# Patient Record
Sex: Female | Born: 1945 | Race: White | Hispanic: No | Marital: Married | Smoking: Former smoker
Health system: Southern US, Community
[De-identification: ages and names within clinical notes are randomized; demographics above are authoritative.]

## PROBLEM LIST (undated history)

## (undated) DIAGNOSIS — J449 Chronic obstructive pulmonary disease, unspecified: Secondary | ICD-10-CM

## (undated) DIAGNOSIS — I2510 Atherosclerotic heart disease of native coronary artery without angina pectoris: Secondary | ICD-10-CM

## (undated) DIAGNOSIS — F329 Major depressive disorder, single episode, unspecified: Secondary | ICD-10-CM

## (undated) DIAGNOSIS — F32A Depression, unspecified: Secondary | ICD-10-CM

## (undated) DIAGNOSIS — G4730 Sleep apnea, unspecified: Secondary | ICD-10-CM

## (undated) DIAGNOSIS — Z85850 Personal history of malignant neoplasm of thyroid: Secondary | ICD-10-CM

## (undated) DIAGNOSIS — M1990 Unspecified osteoarthritis, unspecified site: Secondary | ICD-10-CM

## (undated) DIAGNOSIS — F419 Anxiety disorder, unspecified: Secondary | ICD-10-CM

## (undated) DIAGNOSIS — I10 Essential (primary) hypertension: Secondary | ICD-10-CM

## (undated) DIAGNOSIS — E559 Vitamin D deficiency, unspecified: Secondary | ICD-10-CM

## (undated) DIAGNOSIS — E119 Type 2 diabetes mellitus without complications: Secondary | ICD-10-CM

## (undated) HISTORY — DX: Sleep apnea, unspecified: G47.30

## (undated) HISTORY — DX: Essential (primary) hypertension: I10

## (undated) HISTORY — DX: Personal history of malignant neoplasm of thyroid: Z85.850

## (undated) HISTORY — PX: EYE SURGERY: SHX253

## (undated) HISTORY — PX: CHOLECYSTECTOMY: SHX55

## (undated) HISTORY — DX: Anxiety disorder, unspecified: F41.9

## (undated) HISTORY — PX: APPENDECTOMY: SHX54

## (undated) HISTORY — DX: Vitamin D deficiency, unspecified: E55.9

## (undated) HISTORY — DX: Atherosclerotic heart disease of native coronary artery without angina pectoris: I25.10

## (undated) HISTORY — PX: TONSILLECTOMY: SUR1361

## (undated) HISTORY — DX: Unspecified osteoarthritis, unspecified site: M19.90

## (undated) HISTORY — DX: Chronic obstructive pulmonary disease, unspecified: J44.9

## (undated) HISTORY — PX: ABDOMINAL HYSTERECTOMY: SHX81

## (undated) HISTORY — PX: BRAIN SURGERY: SHX531

## (undated) HISTORY — DX: Depression, unspecified: F32.A

## (undated) HISTORY — DX: Major depressive disorder, single episode, unspecified: F32.9

## (undated) HISTORY — DX: Type 2 diabetes mellitus without complications: E11.9

---

## 2008-05-16 HISTORY — PX: REPLACEMENT TOTAL KNEE: SUR1224

## 2012-09-13 DIAGNOSIS — E1142 Type 2 diabetes mellitus with diabetic polyneuropathy: Secondary | ICD-10-CM | POA: Insufficient documentation

## 2013-09-03 ENCOUNTER — Encounter: Payer: Self-pay | Admitting: Neurology

## 2013-09-03 DIAGNOSIS — G8929 Other chronic pain: Secondary | ICD-10-CM | POA: Insufficient documentation

## 2013-09-03 DIAGNOSIS — E119 Type 2 diabetes mellitus without complications: Secondary | ICD-10-CM | POA: Insufficient documentation

## 2013-09-04 DIAGNOSIS — D329 Benign neoplasm of meninges, unspecified: Secondary | ICD-10-CM | POA: Insufficient documentation

## 2013-09-12 ENCOUNTER — Encounter: Payer: Self-pay | Admitting: Neurology

## 2013-09-13 ENCOUNTER — Encounter: Payer: Self-pay | Admitting: Neurology

## 2013-09-13 ENCOUNTER — Telehealth: Payer: Self-pay | Admitting: Neurology

## 2013-09-13 ENCOUNTER — Ambulatory Visit (INDEPENDENT_AMBULATORY_CARE_PROVIDER_SITE_OTHER): Payer: Medicare Other | Admitting: Neurology

## 2013-09-13 VITALS — BP 110/60 | HR 60 | Resp 18 | Ht 65.0 in | Wt 188.0 lb

## 2013-09-13 DIAGNOSIS — R251 Tremor, unspecified: Secondary | ICD-10-CM

## 2013-09-13 DIAGNOSIS — D329 Benign neoplasm of meninges, unspecified: Secondary | ICD-10-CM

## 2013-09-13 DIAGNOSIS — D320 Benign neoplasm of cerebral meninges: Secondary | ICD-10-CM

## 2013-09-13 DIAGNOSIS — G219 Secondary parkinsonism, unspecified: Secondary | ICD-10-CM

## 2013-09-13 DIAGNOSIS — R259 Unspecified abnormal involuntary movements: Secondary | ICD-10-CM

## 2013-09-13 DIAGNOSIS — G212 Secondary parkinsonism due to other external agents: Secondary | ICD-10-CM

## 2013-09-13 DIAGNOSIS — G8929 Other chronic pain: Secondary | ICD-10-CM

## 2013-09-13 NOTE — Progress Notes (Signed)
April Walls was seen today in the movement disorders clinic for neurologic consultation at the request of FROMSON,GERALD, MD.  The consultation is for the evaluation of PD.  No notes accompany the referral regarding this particular diagnosis.  I was able to review records from [facility] neurology, where the pt was previously seen but there were no records re: PD there either that were sent.  Pt is accompanied by her husband who supplements the history.  Pt reports that the first sx was tremor in both hands and that started 2 years ago.  She states that the shaking progressed and she began to have trouble eating and she didn't really seek medical attention for this until 3-4 months ago, when she went to [facility] neurology.  She was given Sinemet.  She gradually worked up to one pill three times per day but it made her very nauseated and she d/c it.  She does state that it "really helped."  Pt also states that she was hospitalized a few weeks ago with a stroke.  Again, I don't have those records.  Pt states that she was just "out of it" and is scared that it will occur again.  I do have an OT eval from that hospital stay that did report that an MRI of the brain was done on 09/03/2013 demonstrate no acute infarct but a tiny right subdural hematoma without mass effect.  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  There was small vessel disease.  Specific Symptoms:  Tremor: yes Voice: no changes Sleep: not sleeping well right now because of anxiety (states that she was recently in the hospital and she thought that she had a stroke and she has ben worried about that)  Vivid Dreams:  yes  Acting out dreams:  no (rare nights she will) Wet Pillows: no Postural symptoms:  yes  Falls?  yes Bradykinesia symptoms: difficulty getting OOC, low couch;  Loss of smell:  no Loss of taste:   no Urinary Incontinence:  yes (just recently started) Difficulty Swallowing:  no Handwriting, micrographia: yes Trouble with ADL's:  no  Trouble buttoning clothing: unknown Depression:  yes (sees psychiatry; on risperdal for several years and thinks that it helps) Memory changes:  yes (quit driving after TKA few years ago; husband does finances and always has; husband distributes meds x 1 year as it became too much; husband actually thinks that memory is good) Hallucinations:  no  visual distortions: no N/V:  no Lightheaded:  yes  Syncope: no Diplopia:  yes (horizontal, monocular) Dyskinesia:  no   ALLERGIES:   Allergies  Allergen Reactions  . Aminoglycosides   . Amoxicillin   . Bactrim [Sulfamethoxazole-Tmp Ds]   . Ceclor [Cefaclor]   . Doxycycline   . Erythromycin   . Gentamycin [Gentamicin]   . Lipitor [Atorvastatin]   . Metformin And Related   . Penicillins   . Pravastatin   . Sulfa Antibiotics   . Tetracyclines & Related   . Vasocon [Naphazoline]   . Zocor [Simvastatin]     CURRENT MEDICATIONS:  Current Outpatient Prescriptions on File Prior to Visit  Medication Sig Dispense Refill  . ALPRAZolam (XANAX) 1 MG tablet Take 1 mg by mouth 4 (four) times daily.      Marland Kitchen atorvastatin (LIPITOR) 20 MG tablet Take 20 mg by mouth daily.      . carbidopa-levodopa (  PARCOPA) 25-100 MG per disintegrating tablet Take 1 tablet by mouth 3 (three) times daily.      . carbidopa-levodopa (SINEMET IR) 25-100 MG per tablet Take 1 tablet by mouth 3 (three) times daily.      . celecoxib (CELEBREX) 200 MG capsule Take 200 mg by mouth 2 (two) times daily.      . Cholecalciferol (VITAMIN D-3 PO) Take 2,000 Units by mouth daily.      . citalopram (CELEXA) 20 MG tablet Take 20 mg by mouth daily.      . Coenzyme Q10 (CO Q 10 PO) Take by mouth daily.      Marland Kitchen dexlansoprazole (DEXILANT) 60 MG capsule Take 60 mg by mouth daily.      Marland Kitchen donepezil (ARICEPT) 10 MG tablet Take 10 mg by mouth 2 (two) times  daily.      . DULoxetine (CYMBALTA) 60 MG capsule Take 60 mg by mouth 2 (two) times daily.      . fentaNYL (DURAGESIC - DOSED MCG/HR) 25 MCG/HR patch Place 25 mcg onto the skin every 3 (three) days.      . Fluticasone-Salmeterol (ADVAIR) 250-50 MCG/DOSE AEPB Inhale 1 puff into the lungs 2 (two) times daily.      Marland Kitchen glipiZIDE (GLUCOTROL) 10 MG tablet Take 10 mg by mouth 2 (two) times daily before a meal.      . hydrochlorothiazide (HYDRODIURIL) 25 MG tablet Take 25 mg by mouth daily.      Marland Kitchen lamoTRIgine (LAMICTAL) 25 MG tablet Take 25 mg by mouth daily.      Marland Kitchen levothyroxine (SYNTHROID, LEVOTHROID) 200 MCG tablet Take 200 mcg by mouth daily before breakfast.      . magnesium oxide (MAG-OX) 400 MG tablet Take 400 mg by mouth 3 (three) times daily.      . metoprolol (LOPRESSOR) 50 MG tablet Take 50 mg by mouth 2 (two) times daily.      . Multiple Vitamin (MULTIVITAMIN) tablet Take 1 tablet by mouth daily.      . phenytoin (DILANTIN) 100 MG ER capsule Take by mouth 3 (three) times daily.      . potassium chloride (K-DUR) 10 MEQ tablet Take 10 mEq by mouth daily.      . pregabalin (LYRICA) 300 MG capsule Take 300 mg by mouth 2 (two) times daily.      . promethazine (PHENERGAN) 25 MG tablet Take 25 mg by mouth every 6 (six) hours as needed for nausea or vomiting.      . RABEprazole (ACIPHEX) 20 MG tablet Take 20 mg by mouth daily.      . ranitidine (ZANTAC) 150 MG tablet Take 150 mg by mouth 2 (two) times daily.      . risperiDONE (RISPERDAL) 2 MG tablet Take 2 mg by mouth at bedtime.      Marland Kitchen tiotropium (SPIRIVA) 18 MCG inhalation capsule Place 18 mcg into inhaler and inhale daily.      Marland Kitchen tiZANidine (ZANAFLEX) 4 MG tablet Take 4 mg by mouth every 6 (six) hours as needed for muscle spasms.      Marland Kitchen venlafaxine (EFFEXOR) 100 MG tablet Take 100 mg by mouth 3 (three) times daily.       No current facility-administered medications on file prior to visit.    PAST MEDICAL HISTORY:   Past Medical History    Diagnosis Date  . Diabetes   . ASCVD (arteriosclerotic cardiovascular disease)   . COPD (chronic obstructive pulmonary disease)   . Sleep apnea   .  Vitamin D deficiency   . History of thyroid cancer   . Hypertension   . Anxiety   . Arthritis   . Depression     PAST SURGICAL HISTORY:   Past Surgical History  Procedure Laterality Date  . Replacement total knee Right 2010  . Tonsillectomy    . Cholecystectomy    . Abdominal hysterectomy    . Appendectomy    . Brain surgery      meningioma of cerebellum  . Eye surgery      SOCIAL HISTORY:   History   Social History  . Marital Status: Married    Spouse Name: N/A    Number of Children: N/A  . Years of Education: N/A   Occupational History  . Not on file.   Social History Main Topics  . Smoking status: Former Smoker    Quit date: 11/13/1997  . Smokeless tobacco: Not on file  . Alcohol Use: No  . Drug Use: No  . Sexual Activity: Not on file   Other Topics Concern  . Not on file   Social History Narrative  . No narrative on file    FAMILY HISTORY:   Family Status  Relation Status Death Age  . Mother Deceased     abdominal aneurysm, COPD, diabetes  . Father Deceased     colon cancer  . Brother Alive     diabetes  . Brother Alive     diabetes    ROS:  A complete 10 system review of systems was obtained and was unremarkable apart from what is mentioned above.  PHYSICAL EXAMINATION:    VITALS:   Filed Vitals:   09/13/13 1024  BP: 110/60  Pulse: 60  Resp: 18  Height: 5\' 5"  (1.651 m)  Weight: 188 lb (85.276 kg)    GEN:  The patient appears stated age and is in NAD. HEENT:  Normocephalic, atraumatic.  The mucous membranes are moist. The superficial temporal arteries are without ropiness or tenderness. CV:  RRR Lungs:  CTAB Neck/HEME:  There are no carotid bruits bilaterally.  Neurological examination:  Orientation: The patient is alert and oriented x3. Fund of knowledge is appropriate.  Recent  and remote memory are intact.  Attention and concentration are normal.    Able to name objects and repeat phrases. Cranial nerves: There is good facial symmetry. There is facial hypomimia.  Pupils are equal round and reactive to light bilaterally. Fundoscopic exam reveals clear margins bilaterally. Extraocular muscles are intact. The visual fields are full to confrontational testing. The speech is fluent and clear. Soft palate rises symmetrically and there is no tongue deviation. Hearing is intact to conversational tone. Sensation: Sensation is intact to light and pinprick throughout (facial, trunk, extremities). Vibration is decreased  at the bilateral big toe. There is no extinction with double simultaneous stimulation. There is no sensory dermatomal level identified. Motor: Strength is 5/5 in the bilateral upper and lower extremities.   Shoulder shrug is equal and symmetric.  There is no pronator drift. Deep tendon reflexes: Deep tendon reflexes are 1/4 at the bilateral biceps, triceps, brachioradialis, patella and absent at the bilateral achilles. Plantar responses are downgoing bilaterally.  Movement examination: Tone: There is ? Mild increase tone in the LUE but the pt had difficulty relaxing.  Tone in the RUE and bilateral LE was normal Abnormal movements: There is a tremor in the right and left lower extremity and right and left upper extremity that are all independent of one another  and that change frequencies.  It is a distractible tremor (goes away when distracted). Coordination:  There is no significant decremation with RAM's, in the upper or lower extremities bilaterally. Gait and Station: The patient has difficulty arising out of a deep-seated chair without the use of the hands.  She makes 2 unsuccessful attempts, and then pushes off using her hands.  She then gets out of the chair and walks with an astasia abasia gait.  She does not shuffle, but takes very long unsteady strides.  We only  took a few steps in the room, as she was falling all over this examiner.  ASSESSMENT/PLAN:  1.  Parkinsonism  -I do not think that she has idiopathic Parkinson's disease.  I think that the etiology of the parkinsonism is multifactorial.  I think that the Risperdal is a big contribution due to D2 receptor blockade.  She and I talked about this today.  We also talked about the potential role that Phenergan could play as well and I recommend she stop this.  I do not recommend levodopa right now, and she is off of the medication as she did not tolerate it well.  I would recommend returning to her psychiatrist and seeing if there is an alternative to the Risperdal such as Seroquel or Clozaril.  If she is able to come off of the Risperdal, then we will need 6 months to see how she does clinically after she is off of the medication.  In addition to these factors, I do think that there are possibly some psychogenic qualities to her physical examination, which likely contributes to her gait and tremor as well.  I wrote a letter to her psychiatrist as well regarding the above and, at her request, I will talk with her physical therapist, whom I have left a message for Dagoberto Ligas). 2.  Recent hospitalization  -Pt reports recent stroke and now on dilantin.  I will try to get records as I would like to minimize medication as much as possible.  Pt signed release today. 3.  Chronic neck and back pain  -The patient is currently on fentanyl.  She asked me about treating her chronic pain, which I do not do.  However, I would also recommend limiting as much chronic pain medication as possible given that she is a fall risk.  Addendum:  [facility] records received after patient left.  Pt fell 3 days prior to admission, and then presented with generalized weakness and leaning to the right.  MRI with small right SDH with no mass effect.  Neuro recommended no antiplatelets.  Apparently she had some UE jerking movements and  continuous EEG was performed and was negative for epileptiform activity.  The initial continues EEG demonstrated mild to moderate slowing of electrocerebral activity and the following day, there continued to be slowing but no epileptiform activity, despite the fact that the patient had shaking of the right lower extremity.Marland Kitchen Spell on 4/22 described as generalized shaking involving both sides of face, arms and legs, AMS and left sided weakness after.  However, the admission says that she had a prior hx of seizure and her dilantin level was subtherapeutic and they just increased it.  Pt told me today that this was a new medication for her in the hospital.   She had an Echo that showed LV EF has 60-65% and MRA neck was normal on 09/04/13.  MRI of the brain formal report was done on 09/04/2013 and compared to 04/23/2004.  There  was a "tiny" right frontal subdural hematoma without mass effect.  There is a right posterior fossa meningioma measuring 2.0 x 2.0 x 1.9 cm, which invades a portion of the right transverse sinus, but the right transverse sinus appeared patent.  In 2005, the meningioma measured 1.6 x 1.7 x 1.8 cm.  There was no diffusion abnormality on the MRI of the brain dated 09/04/2013.  Ventricles were normal in size.  It was reported to be minimal T2 hyperintensity in the white matter.  MRA of the brain was done on 09/04/2013 which was normal.  The patient was receiving injections of Haldol while in the hospital.

## 2013-09-13 NOTE — Telephone Encounter (Signed)
Jade, please let pt know that I have taken a thorough look at [facility] records and it said in them that she was on dilantin prior to hospital stay for history of seizure but pt told me this was new medication for her???  Can she clarify?

## 2013-09-13 NOTE — Patient Instructions (Signed)
1.  I do not recommend phenergan as it can make parkinsonism worse 2  I do recommend you talk to your psychiatrist to see if there are alternative medications to the risperdal 3.  I will try to get a copy of your records from [facility].  Please feel free to call me in a few weeks to make sure that we have received and reviewed them and to see if we need to follow up before 6 months to discuss the dilantin and why you are on it.

## 2013-09-16 NOTE — Telephone Encounter (Signed)
Left message on machine for patient to call back.

## 2013-09-16 NOTE — Progress Notes (Signed)
Note faxed to Dr Daneil Dolin at [phone].

## 2013-09-17 NOTE — Telephone Encounter (Signed)
Left another message for patient to call back 

## 2013-09-18 NOTE — Telephone Encounter (Signed)
Send certified letter asking her to call us

## 2013-09-18 NOTE — Telephone Encounter (Signed)
Letter written and mailed.

## 2013-09-18 NOTE — Telephone Encounter (Signed)
Patient has not returned any phone calls.

## 2013-10-01 ENCOUNTER — Telehealth: Payer: Self-pay | Admitting: Neurology

## 2013-10-01 NOTE — Telephone Encounter (Signed)
Pt is returning someone call not sure who called her she said please call [phone] or [phone]

## 2013-10-01 NOTE — Telephone Encounter (Signed)
Spoke with patient's husband. Patient is in the hospital. She will call back when she gets out for clarification on her history.

## 2013-12-12 ENCOUNTER — Emergency Department (HOSPITAL_BASED_OUTPATIENT_CLINIC_OR_DEPARTMENT_OTHER): Payer: Medicare Other

## 2013-12-12 ENCOUNTER — Emergency Department (HOSPITAL_BASED_OUTPATIENT_CLINIC_OR_DEPARTMENT_OTHER)
Admission: EM | Admit: 2013-12-12 | Discharge: 2013-12-12 | Disposition: A | Discharge status: Home / Self Care | Payer: Medicare Other | Attending: Emergency Medicine | Admitting: Emergency Medicine

## 2013-12-12 ENCOUNTER — Encounter (HOSPITAL_BASED_OUTPATIENT_CLINIC_OR_DEPARTMENT_OTHER): Payer: Self-pay | Admitting: Emergency Medicine

## 2013-12-12 DIAGNOSIS — Z87891 Personal history of nicotine dependence: Secondary | ICD-10-CM | POA: Insufficient documentation

## 2013-12-12 DIAGNOSIS — Z79899 Other long term (current) drug therapy: Secondary | ICD-10-CM | POA: Insufficient documentation

## 2013-12-12 DIAGNOSIS — E119 Type 2 diabetes mellitus without complications: Secondary | ICD-10-CM | POA: Insufficient documentation

## 2013-12-12 DIAGNOSIS — I10 Essential (primary) hypertension: Secondary | ICD-10-CM | POA: Diagnosis not present

## 2013-12-12 DIAGNOSIS — R519 Headache, unspecified: Secondary | ICD-10-CM

## 2013-12-12 DIAGNOSIS — I2510 Atherosclerotic heart disease of native coronary artery without angina pectoris: Secondary | ICD-10-CM | POA: Insufficient documentation

## 2013-12-12 DIAGNOSIS — J449 Chronic obstructive pulmonary disease, unspecified: Secondary | ICD-10-CM | POA: Insufficient documentation

## 2013-12-12 DIAGNOSIS — F411 Generalized anxiety disorder: Secondary | ICD-10-CM | POA: Diagnosis not present

## 2013-12-12 DIAGNOSIS — F3289 Other specified depressive episodes: Secondary | ICD-10-CM | POA: Diagnosis not present

## 2013-12-12 DIAGNOSIS — Z8739 Personal history of other diseases of the musculoskeletal system and connective tissue: Secondary | ICD-10-CM | POA: Diagnosis not present

## 2013-12-12 DIAGNOSIS — Z880 Allergy status to penicillin: Secondary | ICD-10-CM | POA: Insufficient documentation

## 2013-12-12 DIAGNOSIS — IMO0002 Reserved for concepts with insufficient information to code with codable children: Secondary | ICD-10-CM | POA: Diagnosis not present

## 2013-12-12 DIAGNOSIS — R51 Headache: Secondary | ICD-10-CM | POA: Insufficient documentation

## 2013-12-12 DIAGNOSIS — F329 Major depressive disorder, single episode, unspecified: Secondary | ICD-10-CM | POA: Insufficient documentation

## 2013-12-12 DIAGNOSIS — Z85850 Personal history of malignant neoplasm of thyroid: Secondary | ICD-10-CM | POA: Diagnosis not present

## 2013-12-12 DIAGNOSIS — J4489 Other specified chronic obstructive pulmonary disease: Secondary | ICD-10-CM | POA: Insufficient documentation

## 2013-12-12 LAB — BASIC METABOLIC PANEL
ANION GAP: 13 (ref 5–15)
BUN: 9 mg/dL (ref 6–23)
CALCIUM: 9.1 mg/dL (ref 8.4–10.5)
CO2: 30 mEq/L (ref 19–32)
CREATININE: 0.5 mg/dL (ref 0.50–1.10)
Chloride: 97 mEq/L (ref 96–112)
GFR calc non Af Amer: >90 mL/min (ref >90)
Glucose, Bld: 187 mg/dL — ABNORMAL HIGH (ref 70–99)
Potassium: 3.9 mEq/L (ref 3.7–5.3)
Sodium: 140 mEq/L (ref 137–147)

## 2013-12-12 LAB — URINALYSIS, ROUTINE W REFLEX MICROSCOPIC
BILIRUBIN URINE: NEGATIVE
Glucose, UA: NEGATIVE mg/dL
Hgb urine dipstick: NEGATIVE
Ketones, ur: NEGATIVE mg/dL
Leukocytes, UA: NEGATIVE
NITRITE: NEGATIVE
Protein, ur: NEGATIVE mg/dL
Specific Gravity, Urine: 1.014 (ref 1.005–1.030)
UROBILINOGEN UA: 0.2 mg/dL (ref 0.0–1.0)
pH: 8.5 — ABNORMAL HIGH (ref 5.0–8.0)

## 2013-12-12 LAB — URINE MICROSCOPIC-ADD ON

## 2013-12-12 LAB — CBC
HCT: 42.2 % (ref 36.0–46.0)
Hemoglobin: 14 g/dL (ref 12.0–15.0)
MCH: 31.9 pg (ref 26.0–34.0)
MCHC: 33.2 g/dL (ref 30.0–36.0)
MCV: 96.1 fL (ref 78.0–100.0)
PLATELETS: 234 10*3/uL (ref 150–400)
RBC: 4.39 MIL/uL (ref 3.87–5.11)
RDW: 13.1 % (ref 11.5–15.5)
WBC: 17.6 10*3/uL — ABNORMAL HIGH (ref 4.0–10.5)

## 2013-12-12 MED ORDER — SODIUM CHLORIDE 0.9 % IV BOLUS (SEPSIS)
1000.0000 mL | Freq: Once | INTRAVENOUS | Status: AC
  Administered 2013-12-12: 1000 mL via INTRAVENOUS

## 2013-12-12 MED ORDER — DIPHENHYDRAMINE HCL 50 MG/ML IJ SOLN
25.0000 mg | Freq: Once | INTRAMUSCULAR | Status: AC
  Administered 2013-12-12: 25 mg via INTRAVENOUS
  Filled 2013-12-12: qty 1

## 2013-12-12 MED ORDER — METOCLOPRAMIDE HCL 5 MG/ML IJ SOLN
10.0000 mg | Freq: Once | INTRAMUSCULAR | Status: AC
  Administered 2013-12-12: 10 mg via INTRAVENOUS
  Filled 2013-12-12: qty 2

## 2013-12-12 NOTE — ED Provider Notes (Signed)
CSN: [id number]     Arrival date & time 12/12/13  3382 History   First MD Initiated Contact with Patient 12/12/13 [phone]     Chief Complaint  Patient presents with  . Headache     (Consider location/radiation/quality/duration/timing/severity/associated sxs/prior Treatment) HPI Pt presents with c/o headache which she first noted this morning.  She states that after she woke up headache gradually worsened. She describes pain as diffuse and constant.  No change in vision or speech.  No weakness of arms or legs.  No vomiting but has felt nauseated.  No fever/chills.  She states that last night before bed she felt more tired than usual, but has otherwise been in her usual state of health.  There are no other associated systemic symptoms, there are no other alleviating or modifying factors.   Past Medical History  Diagnosis Date  . Diabetes   . ASCVD (arteriosclerotic cardiovascular disease)   . COPD (chronic obstructive pulmonary disease)   . Sleep apnea   . Vitamin D deficiency   . History of thyroid cancer   . Hypertension   . Anxiety   . Arthritis   . Depression    Past Surgical History  Procedure Laterality Date  . Replacement total knee Right 2010  . Tonsillectomy    . Cholecystectomy    . Abdominal hysterectomy    . Appendectomy    . Brain surgery      meningioma of cerebellum  . Eye surgery     No family history on file. History  Substance Use Topics  . Smoking status: Former Smoker    Quit date: 11/13/1997  . Smokeless tobacco: Not on file  . Alcohol Use: No   OB History   Grav Para Term Preterm Abortions TAB SAB Ect Mult Living                 Review of Systems ROS reviewed and all otherwise negative except for mentioned in HPI    Allergies  Aminoglycosides; Amoxicillin; Bactrim; Ceclor; Doxycycline; Erythromycin; Gentamycin; Lipitor; Metformin and related; Penicillins; Pravastatin; Sulfa antibiotics; Tetracyclines & related; Vasocon; and Zocor  Home  Medications   Prior to Admission medications   Medication Sig Start Date End Date Taking? Authorizing Provider  HYDROmorphone (DILAUDID) 2 MG tablet Take 2 mg by mouth every 4 (four) hours as needed for severe pain.   Yes Historical Provider, MD  ALPRAZolam Duanne Moron) 1 MG tablet Take 1 mg by mouth 4 (four) times daily.    Historical Provider, MD  atorvastatin (LIPITOR) 20 MG tablet Take 20 mg by mouth daily.    Historical Provider, MD  carbidopa-levodopa (PARCOPA) 25-100 MG per disintegrating tablet Take 1 tablet by mouth 3 (three) times daily.    Historical Provider, MD  carbidopa-levodopa (SINEMET IR) 25-100 MG per tablet Take 1 tablet by mouth 3 (three) times daily.    Historical Provider, MD  celecoxib (CELEBREX) 200 MG capsule Take 200 mg by mouth 2 (two) times daily.    Historical Provider, MD  Cholecalciferol (VITAMIN D-3 PO) Take 2,000 Units by mouth daily.    Historical Provider, MD  citalopram (CELEXA) 20 MG tablet Take 20 mg by mouth daily.    Historical Provider, MD  Coenzyme Q10 (CO Q 10 PO) Take by mouth daily.    Historical Provider, MD  dexlansoprazole (DEXILANT) 60 MG capsule Take 60 mg by mouth daily.    Historical Provider, MD  donepezil (ARICEPT) 10 MG tablet Take 10 mg by mouth 2 (two) times  daily.    Historical Provider, MD  DULoxetine (CYMBALTA) 60 MG capsule Take 60 mg by mouth 2 (two) times daily.    Historical Provider, MD  fentaNYL (DURAGESIC - DOSED MCG/HR) 25 MCG/HR patch Place 25 mcg onto the skin every 3 (three) days.    Historical Provider, MD  Fluticasone-Salmeterol (ADVAIR) 250-50 MCG/DOSE AEPB Inhale 1 puff into the lungs 2 (two) times daily.    Historical Provider, MD  glipiZIDE (GLUCOTROL) 10 MG tablet Take 10 mg by mouth 2 (two) times daily before a meal.    Historical Provider, MD  hydrochlorothiazide (HYDRODIURIL) 25 MG tablet Take 25 mg by mouth daily.    Historical Provider, MD  lamoTRIgine (LAMICTAL) 25 MG tablet Take 25 mg by mouth daily.    Historical  Provider, MD  levothyroxine (SYNTHROID, LEVOTHROID) 200 MCG tablet Take 200 mcg by mouth daily before breakfast.    Historical Provider, MD  magnesium oxide (MAG-OX) 400 MG tablet Take 400 mg by mouth 3 (three) times daily.    Historical Provider, MD  metoprolol (LOPRESSOR) 50 MG tablet Take 50 mg by mouth 2 (two) times daily.    Historical Provider, MD  Multiple Vitamin (MULTIVITAMIN) tablet Take 1 tablet by mouth daily.    Historical Provider, MD  phenytoin (DILANTIN) 100 MG ER capsule Take by mouth 3 (three) times daily.    Historical Provider, MD  potassium chloride (K-DUR) 10 MEQ tablet Take 10 mEq by mouth daily.    Historical Provider, MD  pregabalin (LYRICA) 300 MG capsule Take 300 mg by mouth 2 (two) times daily.    Historical Provider, MD  promethazine (PHENERGAN) 25 MG tablet Take 25 mg by mouth every 6 (six) hours as needed for nausea or vomiting.    Historical Provider, MD  RABEprazole (ACIPHEX) 20 MG tablet Take 20 mg by mouth daily.    Historical Provider, MD  ranitidine (ZANTAC) 150 MG tablet Take 150 mg by mouth 2 (two) times daily.    Historical Provider, MD  risperiDONE (RISPERDAL) 2 MG tablet Take 2 mg by mouth at bedtime.    Historical Provider, MD  tiotropium (SPIRIVA) 18 MCG inhalation capsule Place 18 mcg into inhaler and inhale daily.    Historical Provider, MD  tiZANidine (ZANAFLEX) 4 MG tablet Take 4 mg by mouth every 6 (six) hours as needed for muscle spasms.    Historical Provider, MD  venlafaxine (EFFEXOR) 100 MG tablet Take 100 mg by mouth 3 (three) times daily.    Historical Provider, MD   BP 144/92  Pulse 99  Temp(Src) 98.9 F (37.2 C) (Oral)  Resp 18  Ht 5\' 5"  (1.651 m)  Wt 188 lb (85.276 kg)  BMI 31.28 kg/m2  SpO2 97% Vitals reviewed Physical Exam Physical Examination: General appearance - alert, well appearing, and in no distress Mental status - alert, oriented to person, place, and time Eyes - pupils equal and reactive, extraocular eye movements  intact Mouth - mucous membranes moist, pharynx normal without lesions Neck - supple, no significant adenopathy Chest - clear to auscultation, no wheezes, rales or rhonchi, symmetric air entry Heart - normal rate, regular rhythm, normal S1, S2, no murmurs, rubs, clicks or gallops Abdomen - soft, nontender, nondistended, no masses or organomegaly Neurological - alert, oriented x 3, cranial nerves 2-12 tested and intact, strength 5/5 in extremities x 4, sensation intact Extremities - peripheral pulses normal, no pedal edema, no clubbing or cyanosis Skin - normal coloration and turgor, no rashes  ED Course  Procedures (  including critical care time)  12:20 PM pt is satting 92-94% on RA.  She has had resolution of her headache.  She seems to have mild wheezing which may be affected her O2 sat, she also has hx of COPD and has her inhalers to use at home.   Labs Review Labs Reviewed  CBC - Abnormal; Notable for the following:    WBC 17.6 (*)    All other components within normal limits  BASIC METABOLIC PANEL - Abnormal; Notable for the following:    Glucose, Bld 187 (*)    All other components within normal limits  URINALYSIS, ROUTINE W REFLEX MICROSCOPIC - Abnormal; Notable for the following:    APPearance TURBID (*)    pH 8.5 (*)    All other components within normal limits  URINE MICROSCOPIC-ADD ON - Abnormal; Notable for the following:    Bacteria, UA FEW (*)    All other components within normal limits  URINE CULTURE    Imaging Review Dg Chest 2 View  12/12/2013   CLINICAL DATA:  Headache.  EXAM: CHEST  2 VIEW  COMPARISON:  None.  FINDINGS: Mediastinum and hilar structures normal. Lungs are clear. Cardiac structures are unremarkable. No pleural effusion or pneumothorax. No acute bony abnormality.  IMPRESSION: No acute abnormality.   Electronically Signed   By: Marcello Moores  Register   On: 12/12/2013 11:57   Ct Head Wo Contrast  12/12/2013   CLINICAL DATA:  Headaches  EXAM: CT HEAD WITHOUT  CONTRAST  TECHNIQUE: Contiguous axial images were obtained from the base of the skull through the vertex without intravenous contrast.  COMPARISON:  05/12/2012  FINDINGS: The bony calvarium is intact. A calcified meningioma is again noted in the right posterior fossa and stable. Very mild atrophic changes are seen. Mild chronic white matter ischemic changes noted. No findings to suggest acute hemorrhage, acute infarction or other space-occupying mass lesion are noted.  IMPRESSION: Chronic changes without acute abnormality.  Stable right posterior fossa meningioma.   Electronically Signed   By: Inez Catalina M.D.   On: 12/12/2013 10:25     EKG Interpretation None      MDM   Final diagnoses:  Headache, unspecified headache type    Pt presenting with c/o headache.  She has a normal neurologic exam. Head CT reassuring, she knows about the meningioma and it appears stable per radiology.  Pt was found to have o2 sats in low 90s- mid 90s off oxygen-she does have hx of COPD.  Due to having leukocytosis CXR obtained and this appeared normal as well.   Xray images reviewed and interpreted by me as well.  Urinalysis is not convincing for UTI, and urine cutlure sent. After migraine cocktail in the ED patient has had complete resolution of the headache and is requesting discharge.  She was advised to see her PMD and have bloodwork rechecked. Discharged with strict return precautions.  Pt agreeable with plan.     Threasa Beards, MD 12/13/13 [phone]

## 2013-12-12 NOTE — ED Notes (Signed)
Headache that started at midnight unrelieved after taking 2 PO Dilaudid at 0600am.  Visual changes.

## 2013-12-12 NOTE — ED Notes (Signed)
In and out cath performed using sterile technique.  Pt tolerated well

## 2013-12-12 NOTE — ED Notes (Signed)
Place pt on 2 liters of oxygen (nasal cannula) due to low saturations

## 2013-12-12 NOTE — Discharge Instructions (Signed)
Return to the ED with any concerns including fever/chills, vomiting and not able to keep down liquids, changes in vision or speech, weakness in arm or leg, fainting, decreased level of alertness/lethargy, or any other alarming symptoms

## 2013-12-12 NOTE — ED Notes (Signed)
Patient transported to CT 

## 2013-12-13 LAB — URINE CULTURE
Colony Count: NO GROWTH
Culture: NO GROWTH

## 2014-02-16 ENCOUNTER — Emergency Department (HOSPITAL_BASED_OUTPATIENT_CLINIC_OR_DEPARTMENT_OTHER): Payer: Medicare Other

## 2014-02-16 ENCOUNTER — Emergency Department (HOSPITAL_BASED_OUTPATIENT_CLINIC_OR_DEPARTMENT_OTHER)
Admission: EM | Admit: 2014-02-16 | Discharge: 2014-02-16 | Disposition: A | Discharge status: Home / Self Care | Payer: Medicare Other | Attending: Emergency Medicine | Admitting: Emergency Medicine

## 2014-02-16 ENCOUNTER — Encounter (HOSPITAL_BASED_OUTPATIENT_CLINIC_OR_DEPARTMENT_OTHER): Payer: Self-pay | Admitting: Emergency Medicine

## 2014-02-16 DIAGNOSIS — Z880 Allergy status to penicillin: Secondary | ICD-10-CM | POA: Diagnosis not present

## 2014-02-16 DIAGNOSIS — R1084 Generalized abdominal pain: Secondary | ICD-10-CM | POA: Diagnosis present

## 2014-02-16 DIAGNOSIS — J449 Chronic obstructive pulmonary disease, unspecified: Secondary | ICD-10-CM | POA: Diagnosis not present

## 2014-02-16 DIAGNOSIS — Z7951 Long term (current) use of inhaled steroids: Secondary | ICD-10-CM | POA: Diagnosis not present

## 2014-02-16 DIAGNOSIS — Z87891 Personal history of nicotine dependence: Secondary | ICD-10-CM | POA: Insufficient documentation

## 2014-02-16 DIAGNOSIS — F419 Anxiety disorder, unspecified: Secondary | ICD-10-CM | POA: Diagnosis not present

## 2014-02-16 DIAGNOSIS — Y9389 Activity, other specified: Secondary | ICD-10-CM | POA: Diagnosis not present

## 2014-02-16 DIAGNOSIS — Y9289 Other specified places as the place of occurrence of the external cause: Secondary | ICD-10-CM | POA: Diagnosis not present

## 2014-02-16 DIAGNOSIS — Z9049 Acquired absence of other specified parts of digestive tract: Secondary | ICD-10-CM | POA: Insufficient documentation

## 2014-02-16 DIAGNOSIS — S80911A Unspecified superficial injury of right knee, initial encounter: Secondary | ICD-10-CM | POA: Insufficient documentation

## 2014-02-16 DIAGNOSIS — Z7901 Long term (current) use of anticoagulants: Secondary | ICD-10-CM | POA: Insufficient documentation

## 2014-02-16 DIAGNOSIS — R0789 Other chest pain: Secondary | ICD-10-CM | POA: Insufficient documentation

## 2014-02-16 DIAGNOSIS — E119 Type 2 diabetes mellitus without complications: Secondary | ICD-10-CM | POA: Diagnosis not present

## 2014-02-16 DIAGNOSIS — I2510 Atherosclerotic heart disease of native coronary artery without angina pectoris: Secondary | ICD-10-CM | POA: Diagnosis not present

## 2014-02-16 DIAGNOSIS — Z79899 Other long term (current) drug therapy: Secondary | ICD-10-CM | POA: Diagnosis not present

## 2014-02-16 DIAGNOSIS — E559 Vitamin D deficiency, unspecified: Secondary | ICD-10-CM | POA: Insufficient documentation

## 2014-02-16 DIAGNOSIS — R110 Nausea: Secondary | ICD-10-CM | POA: Diagnosis not present

## 2014-02-16 DIAGNOSIS — I6782 Cerebral ischemia: Secondary | ICD-10-CM | POA: Diagnosis not present

## 2014-02-16 DIAGNOSIS — R1013 Epigastric pain: Secondary | ICD-10-CM | POA: Diagnosis not present

## 2014-02-16 DIAGNOSIS — M1990 Unspecified osteoarthritis, unspecified site: Secondary | ICD-10-CM | POA: Diagnosis not present

## 2014-02-16 DIAGNOSIS — Z85850 Personal history of malignant neoplasm of thyroid: Secondary | ICD-10-CM | POA: Insufficient documentation

## 2014-02-16 DIAGNOSIS — F329 Major depressive disorder, single episode, unspecified: Secondary | ICD-10-CM | POA: Diagnosis not present

## 2014-02-16 DIAGNOSIS — Z90710 Acquired absence of both cervix and uterus: Secondary | ICD-10-CM | POA: Insufficient documentation

## 2014-02-16 DIAGNOSIS — W0110XA Fall on same level from slipping, tripping and stumbling with subsequent striking against unspecified object, initial encounter: Secondary | ICD-10-CM | POA: Diagnosis not present

## 2014-02-16 DIAGNOSIS — I10 Essential (primary) hypertension: Secondary | ICD-10-CM | POA: Diagnosis not present

## 2014-02-16 LAB — COMPREHENSIVE METABOLIC PANEL
ALT: 24 U/L (ref 0–35)
ANION GAP: 13 (ref 5–15)
AST: 33 U/L (ref 0–37)
Albumin: 3.3 g/dL — ABNORMAL LOW (ref 3.5–5.2)
Alkaline Phosphatase: 153 U/L — ABNORMAL HIGH (ref 39–117)
BUN: 13 mg/dL (ref 6–23)
CALCIUM: 9.1 mg/dL (ref 8.4–10.5)
CO2: 32 mEq/L (ref 19–32)
CREATININE: 0.8 mg/dL (ref 0.50–1.10)
Chloride: 92 mEq/L — ABNORMAL LOW (ref 96–112)
GFR calc Af Amer: 85 mL/min — ABNORMAL LOW (ref >90)
GFR calc non Af Amer: 74 mL/min — ABNORMAL LOW (ref >90)
GLUCOSE: 255 mg/dL — AB (ref 70–99)
Potassium: 3.6 mEq/L — ABNORMAL LOW (ref 3.7–5.3)
SODIUM: 137 meq/L (ref 137–147)
TOTAL PROTEIN: 7.5 g/dL (ref 6.0–8.3)
Total Bilirubin: 0.2 mg/dL — ABNORMAL LOW (ref 0.3–1.2)

## 2014-02-16 LAB — CBC
HCT: 37.2 % (ref 36.0–46.0)
Hemoglobin: 12.7 g/dL (ref 12.0–15.0)
MCH: 31.7 pg (ref 26.0–34.0)
MCHC: 34.1 g/dL (ref 30.0–36.0)
MCV: 92.8 fL (ref 78.0–100.0)
Platelets: 339 10*3/uL (ref 150–400)
RBC: 4.01 MIL/uL (ref 3.87–5.11)
RDW: 13.4 % (ref 11.5–15.5)
WBC: 9.8 10*3/uL (ref 4.0–10.5)

## 2014-02-16 LAB — LIPASE, BLOOD: LIPASE: 55 U/L (ref 11–59)

## 2014-02-16 LAB — TROPONIN I: Troponin I: <0.3 ng/mL (ref <0.30)

## 2014-02-16 MED ORDER — ONDANSETRON 4 MG PO TBDP
4.0000 mg | ORAL_TABLET | Freq: Once | ORAL | Status: AC
  Administered 2014-02-16: 4 mg via ORAL
  Filled 2014-02-16: qty 1

## 2014-02-16 MED ORDER — GI COCKTAIL ~~LOC~~
30.0000 mL | Freq: Once | ORAL | Status: AC
  Administered 2014-02-16: 30 mL via ORAL
  Filled 2014-02-16: qty 30

## 2014-02-16 NOTE — ED Notes (Signed)
Pt's husband at bedside -- reports that pt fell this past Friday (2 days ago). Also reports that pt is typically anxious and emotional. Husband comforting pt. Connye Burkitt, RN

## 2014-02-16 NOTE — Discharge Instructions (Signed)
Chest Wall Pain °Chest wall pain is pain in or around the bones and muscles of your chest. It may take up to 6 weeks to get better. It may take longer if you must stay physically active in your work and activities.  °CAUSES  °Chest wall pain may happen on its own. However, it may be caused by: °· A viral illness like the flu. °· Injury. °· Coughing. °· Exercise. °· Arthritis. °· Fibromyalgia. °· Shingles. °HOME CARE INSTRUCTIONS  °· Avoid overtiring physical activity. Try not to strain or perform activities that cause pain. This includes any activities using your chest or your abdominal and side muscles, especially if heavy weights are used. °· Put ice on the sore area. °¨ Put ice in a plastic bag. °¨ Place a towel between your skin and the bag. °¨ Leave the ice on for 15-20 minutes per hour while awake for the first 2 days. °· Only take over-the-counter or prescription medicines for pain, discomfort, or fever as directed by your caregiver. °SEEK IMMEDIATE MEDICAL CARE IF:  °· Your pain increases, or you are very uncomfortable. °· You have a fever. °· Your chest pain becomes worse. °· You have new, unexplained symptoms. °· You have nausea or vomiting. °· You feel sweaty or lightheaded. °· You have a cough with phlegm (sputum), or you cough up blood. °MAKE SURE YOU:  °· Understand these instructions. °· Will watch your condition. °· Will get help right away if you are not doing well or get worse. °Document Released: 05/02/2005 Document Revised: 07/25/2011 Document Reviewed: 12/27/2010 °[employer]® Patient Information ©2015 [employer]. This information is not intended to replace advice given to you by your health care provider. Make sure you discuss any questions you have with your health care provider. ° °Chest Pain (Nonspecific) °It is often hard to give a specific diagnosis for the cause of chest pain. There is always a chance that your pain could be related to something serious, such as a heart attack or a blood  clot in the lungs. You need to follow up with your health care provider for further evaluation. °CAUSES  °· Heartburn. °· Pneumonia or bronchitis. °· Anxiety or stress. °· Inflammation around your heart (pericarditis) or lung (pleuritis or pleurisy). °· A blood clot in the lung. °· A collapsed lung (pneumothorax). It can develop suddenly on its own (spontaneous pneumothorax) or from trauma to the chest. °· Shingles infection (herpes zoster virus). °The chest wall is composed of bones, muscles, and cartilage. Any of these can be the source of the pain. °· The bones can be bruised by injury. °· The muscles or cartilage can be strained by coughing or overwork. °· The cartilage can be affected by inflammation and become sore (costochondritis). °DIAGNOSIS  °Lab tests or other studies may be needed to find the cause of your pain. Your health care provider may have you take a test called an ambulatory electrocardiogram (ECG). An ECG records your heartbeat patterns over a 24-hour period. You may also have other tests, such as: °· Transthoracic echocardiogram (TTE). During echocardiography, sound waves are used to evaluate how blood flows through your heart. °· Transesophageal echocardiogram (TEE). °· Cardiac monitoring. This allows your health care provider to monitor your heart rate and rhythm in real time. °· Holter monitor. This is a portable device that records your heartbeat and can help diagnose heart arrhythmias. It allows your health care provider to track your heart activity for several days, if needed. °· Stress tests by   exercise or by giving medicine that makes the heart beat faster. °TREATMENT  °· Treatment depends on what may be causing your chest pain. Treatment may include: °¨ Acid blockers for heartburn. °¨ Anti-inflammatory medicine. °¨ Pain medicine for inflammatory conditions. °¨ Antibiotics if an infection is present. °· You may be advised to change lifestyle habits. This includes stopping smoking and  avoiding alcohol, caffeine, and chocolate. °· You may be advised to keep your head raised (elevated) when sleeping. This reduces the chance of acid going backward from your stomach into your esophagus. °Most of the time, nonspecific chest pain will improve within 2-3 days with rest and mild pain medicine.  °HOME CARE INSTRUCTIONS  °· If antibiotics were prescribed, take them as directed. Finish them even if you start to feel better. °· For the next few days, avoid physical activities that bring on chest pain. Continue physical activities as directed. °· Do not use any tobacco products, including cigarettes, chewing tobacco, or electronic cigarettes. °· Avoid drinking alcohol. °· Only take medicine as directed by your health care provider. °· Follow your health care provider's suggestions for further testing if your chest pain does not go away. °· Keep any follow-up appointments you made. If you do not go to an appointment, you could develop lasting (chronic) problems with pain. If there is any problem keeping an appointment, call to reschedule. °SEEK MEDICAL CARE IF:  °· Your chest pain does not go away, even after treatment. °· You have a rash with blisters on your chest. °· You have a fever. °SEEK IMMEDIATE MEDICAL CARE IF:  °· You have increased chest pain or pain that spreads to your arm, neck, jaw, back, or abdomen. °· You have shortness of breath. °· You have an increasing cough, or you cough up blood. °· You have severe back or abdominal pain. °· You feel nauseous or vomit. °· You have severe weakness. °· You faint. °· You have chills. °This is an emergency. Do not wait to see if the pain will go away. Get medical help at once. Call your local emergency services (911 in [place] Do not drive yourself to the hospital. °MAKE SURE YOU:  °· Understand these instructions. °· Will watch your condition. °· Will get help right away if you are not doing well or get worse. °Document Released: 02/09/2005 Document Revised:  05/07/2013 Document Reviewed: 12/06/2007 °[employer]® Patient Information ©2015 [employer]. This information is not intended to replace advice given to you by your health care provider. Make sure you discuss any questions you have with your health care provider. ° °

## 2014-02-16 NOTE — ED Provider Notes (Signed)
CSN: [id number]     Arrival date & time 02/16/14  1929 History   This chart was scribed for Evelina Bucy, MD by Randa Evens, ED Scribe. This patient was seen in room MH10/MH10 and the patient's care was started at 7:35 PM.      Chief Complaint  Patient presents with  . Abdominal Pain   Patient is a 68 y.o. female presenting with abdominal pain. The history is provided by the patient. No language interpreter was used.  Abdominal Pain Pain location:  Generalized Pain radiates to:  Does not radiate Pain severity:  Moderate Duration:  1 day Timing:  Constant Progression:  Unchanged Chronicity:  Recurrent Relieved by:  None tried Worsened by:  Movement and eating Ineffective treatments:  None tried Associated symptoms: chest pain (rib pain) and nausea   Associated symptoms: no vomiting    HPI Comments: Merri Dimaano is a 68 y.o. female who presents to the Emergency Department complaining of fall onset 2 days ago. She states fell when going to bed from her wheel chair. She states during her initial fall she fell down onto her knees. She states she hit her head when she fell but denies LOC.   She states she is having new right sided abdominal/ rib pain onset today. She states she has been having some associated nausea. She states she thinks the rib pain is related to the fall or her Hx of acid reflux. She was seen 2 days prior and she states she has an endoscopy performed. She states she thinks that her pain today is from her stomach acid. She states she takes a GI cocktail four times daily but has not been compliant with taking it today due to being at the health center in [place]. She states the last time she took the GI cocktail was tonight before dinner. She states that her rib pain worsened today after eating fried chicken. She states her pain feels similar to previous episodes of acid reflux. Denies vomiting  Past Medical History  Diagnosis Date  . Diabetes   . ASCVD  (arteriosclerotic cardiovascular disease)   . COPD (chronic obstructive pulmonary disease)   . Sleep apnea   . Vitamin D deficiency   . History of thyroid cancer   . Hypertension   . Anxiety   . Arthritis   . Depression    Past Surgical History  Procedure Laterality Date  . Replacement total knee Right 2010  . Tonsillectomy    . Cholecystectomy    . Abdominal hysterectomy    . Appendectomy    . Brain surgery      meningioma of cerebellum  . Eye surgery     No family history on file. History  Substance Use Topics  . Smoking status: Former Smoker    Quit date: 11/13/1997  . Smokeless tobacco: Never Used  . Alcohol Use: No   OB History   Grav Para Term Preterm Abortions TAB SAB Ect Mult Living                 Review of Systems  Cardiovascular: Positive for chest pain (rib pain).  Gastrointestinal: Positive for nausea and abdominal pain. Negative for vomiting.  Neurological: Negative for syncope.  All other systems reviewed and are negative.   Allergies  Aminoglycosides; Amoxicillin; Bactrim; Ceclor; Doxycycline; Erythromycin; Gentamycin; Ibuprofen; Lipitor; Metformin and related; Penicillins; Pravastatin; Sulfa antibiotics; Tetracyclines & related; Vasocon; and Zocor  Home Medications   Prior to Admission medications   Medication Sig  Start Date End Date Taking? Authorizing Provider  ALPRAZolam Duanne Moron) 1 MG tablet Take 1 mg by mouth 4 (four) times daily.    Historical Provider, MD  atorvastatin (LIPITOR) 20 MG tablet Take 20 mg by mouth daily.    Historical Provider, MD  carbidopa-levodopa (PARCOPA) 25-100 MG per disintegrating tablet Take 1 tablet by mouth 3 (three) times daily.    Historical Provider, MD  carbidopa-levodopa (SINEMET IR) 25-100 MG per tablet Take 1 tablet by mouth 3 (three) times daily.    Historical Provider, MD  celecoxib (CELEBREX) 200 MG capsule Take 200 mg by mouth 2 (two) times daily.    Historical Provider, MD  Cholecalciferol (VITAMIN D-3 PO)  Take 2,000 Units by mouth daily.    Historical Provider, MD  citalopram (CELEXA) 20 MG tablet Take 20 mg by mouth daily.    Historical Provider, MD  Coenzyme Q10 (CO Q 10 PO) Take by mouth daily.    Historical Provider, MD  dexlansoprazole (DEXILANT) 60 MG capsule Take 60 mg by mouth daily.    Historical Provider, MD  donepezil (ARICEPT) 10 MG tablet Take 10 mg by mouth 2 (two) times daily.    Historical Provider, MD  DULoxetine (CYMBALTA) 60 MG capsule Take 60 mg by mouth 2 (two) times daily.    Historical Provider, MD  fentaNYL (DURAGESIC - DOSED MCG/HR) 25 MCG/HR patch Place 25 mcg onto the skin every 3 (three) days.    Historical Provider, MD  Fluticasone-Salmeterol (ADVAIR) 250-50 MCG/DOSE AEPB Inhale 1 puff into the lungs 2 (two) times daily.    Historical Provider, MD  glipiZIDE (GLUCOTROL) 10 MG tablet Take 10 mg by mouth 2 (two) times daily before a meal.    Historical Provider, MD  hydrochlorothiazide (HYDRODIURIL) 25 MG tablet Take 25 mg by mouth daily.    Historical Provider, MD  HYDROmorphone (DILAUDID) 2 MG tablet Take 2 mg by mouth every 4 (four) hours as needed for severe pain.    Historical Provider, MD  lamoTRIgine (LAMICTAL) 25 MG tablet Take 25 mg by mouth daily.    Historical Provider, MD  levothyroxine (SYNTHROID, LEVOTHROID) 200 MCG tablet Take 200 mcg by mouth daily before breakfast.    Historical Provider, MD  magnesium oxide (MAG-OX) 400 MG tablet Take 400 mg by mouth 3 (three) times daily.    Historical Provider, MD  metoprolol (LOPRESSOR) 50 MG tablet Take 50 mg by mouth 2 (two) times daily.    Historical Provider, MD  Multiple Vitamin (MULTIVITAMIN) tablet Take 1 tablet by mouth daily.    Historical Provider, MD  phenytoin (DILANTIN) 100 MG ER capsule Take by mouth 3 (three) times daily.    Historical Provider, MD  potassium chloride (K-DUR) 10 MEQ tablet Take 10 mEq by mouth daily.    Historical Provider, MD  pregabalin (LYRICA) 300 MG capsule Take 300 mg by mouth 2  (two) times daily.    Historical Provider, MD  promethazine (PHENERGAN) 25 MG tablet Take 25 mg by mouth every 6 (six) hours as needed for nausea or vomiting.    Historical Provider, MD  RABEprazole (ACIPHEX) 20 MG tablet Take 20 mg by mouth daily.    Historical Provider, MD  ranitidine (ZANTAC) 150 MG tablet Take 150 mg by mouth 2 (two) times daily.    Historical Provider, MD  risperiDONE (RISPERDAL) 2 MG tablet Take 2 mg by mouth at bedtime.    Historical Provider, MD  tiotropium (SPIRIVA) 18 MCG inhalation capsule Place 18 mcg into inhaler and inhale  daily.    Historical Provider, MD  tiZANidine (ZANAFLEX) 4 MG tablet Take 4 mg by mouth every 6 (six) hours as needed for muscle spasms.    Historical Provider, MD  venlafaxine (EFFEXOR) 100 MG tablet Take 100 mg by mouth 3 (three) times daily.    Historical Provider, MD   Triage Vitals: BP 153/78  Pulse 125  Temp(Src) 98.9 F (37.2 C) (Oral)  Resp 20  Ht 5\' 5"  (1.651 m)  Wt 197 lb (89.359 kg)  BMI 32.78 kg/m2  SpO2 95%  Physical Exam  Nursing note and vitals reviewed. Constitutional: She is oriented to person, place, and time. She appears well-developed and well-nourished.  HENT:  Head: Normocephalic and atraumatic.  Right Ear: External ear normal.  Left Ear: External ear normal.  Nose: Nose normal.  Mouth/Throat: Oropharynx is clear and moist.  Eyes: Conjunctivae and EOM are normal. Pupils are equal, round, and reactive to light.  Neck: Normal range of motion. Neck supple.  Cardiovascular: Normal rate, regular rhythm, normal heart sounds and intact distal pulses.   Pulmonary/Chest: Effort normal and breath sounds normal.  Abdominal: Soft. Bowel sounds are normal. She exhibits no distension. There is tenderness (mild, diffuse). There is no rebound and no guarding.  Musculoskeletal:       Right knee: She exhibits decreased range of motion and swelling. She exhibits no effusion (can move knee 0-90).  Neurological: She is alert and  oriented to person, place, and time. She has normal reflexes.  Skin: Skin is warm and dry.  Psychiatric: She has a normal mood and affect. Her behavior is normal. Judgment and thought content normal.    ED Course  Procedures (including critical care time) DIAGNOSTIC STUDIES: Oxygen Saturation is 95% on RA, adequate by my interpretation.    COORDINATION OF CARE: 7:53 PM-Discussed treatment plan which includes GI cocktail with pt at bedside and pt agreed to plan.     Labs Review Labs Reviewed  CBC  COMPREHENSIVE METABOLIC PANEL  LIPASE, BLOOD  TROPONIN I    Imaging Review Dg Chest 2 View  02/16/2014   CLINICAL DATA:  Chest and rib pain after a fall today.  EXAM: CHEST  2 VIEW  COMPARISON:  PA and lateral chest 12/12/2013.  CT chest 05/13/2012.  FINDINGS: The chest is hyperexpanded with attenuation of the pulmonary vasculature compatible with emphysema. Lungs are clear. No pneumothorax or pleural effusion. Heart size is normal.  IMPRESSION: No acute finding.  Emphysema.   Electronically Signed   By: Inge Rise M.D.   On: 02/16/2014 20:35   Ct Head Wo Contrast  02/16/2014   CLINICAL DATA:  Patient fell 2 days ago at home striking forehead on dresser. Complains of frontal soreness. Altered mental status.  EXAM: CT HEAD WITHOUT CONTRAST  TECHNIQUE: Contiguous axial images were obtained from the base of the skull through the vertex without intravenous contrast.  COMPARISON:  12/12/2013  FINDINGS: Ventricles, cisterns and other CSF spaces are within normal. There is minimal chronic ischemic microvascular disease. There is no change in patient's dural-based calcified mass over the posterior aspect of the right posterior fossa likely calcified meningioma. There is no mass effect, midline shift or acute hemorrhage. There is no evidence of acute infarction. Remaining bones and soft tissues are unremarkable.  IMPRESSION: No acute intracranial findings.  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  Mild chronic ischemic microvascular disease.   Electronically Signed   By: Marin Olp M.D.  On: 02/16/2014 20:34     EKG Interpretation None      Date: 02/16/2014  Rate: 121  Rhythm: normal sinus rhythm  QRS Axis: normal  Intervals: normal  ST/T Wave abnormalities: normal  Conduction Disutrbances:none  Narrative Interpretation:   Old EKG Reviewed: none available    MDM   Final diagnoses:  Burning chest pain   Patient here with burning epigastric pain. Began after eating fried chicken. Had recent EGD 2 days ago, has been doing well since. Had a fall 2 days ago, hit her R knee and head. CT Head ordered for tomorrow by PCP, will do it now. On exam, no crepitus, lungs clear. Extremely tearful. When asked why she is tearful, she told me, "I hate that you would think negative of my husband, he is a good man." There is a definite Psych component to her symptoms.  EKG with sinus tach, rate in the 120s, due to her crying and being upset. Will check CXR, doubt perforated esophagus with no crepitus and well-appearance. She has been taking special GI cocktails 4 times a day and didn't get all her doses today because she was getting evaluated for her knee and head pain.  Labs ok. The burning eased off after GI cocktail. Patient doesn't want further testing, she would like to go home. I discussed this with husband, he would like to go home as well since she wants to. I explained I would like her to stay for further cardiac testing and abdominal CT, but she would like to go home.  I instructed patient to f/u with her PCP tomorrow.     I personally performed the services described in this documentation, which was scribed in my presence. The recorded information has been reviewed and is accurate.       Evelina Bucy, MD 02/16/14 2122

## 2014-02-16 NOTE — ED Notes (Signed)
EMS called by pt -- pt reports her R ribs began hurting; reports fall a week ago Friday saying she hit her head and R knee; pt very tearful at this time. Connye Burkitt, RN

## 2014-02-16 NOTE — ED Notes (Signed)
Patient complains of rib and chest pain.

## 2014-02-24 ENCOUNTER — Telehealth: Payer: Self-pay | Admitting: Neurology

## 2014-02-24 NOTE — Telephone Encounter (Signed)
I talked with the PT taking care of the patient.  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  She had fall and fx hip and has been dealing with chronic pain since and not progressing with therapy due to pain/narcotic pain meds.  However, therapist did say tremor gone now.  Pt can f/u prn

## 2014-02-24 NOTE — Telephone Encounter (Signed)
Pt called to cancel her 03/21/14 f/u appt. She did not want to give a reason to why.

## 2014-03-21 ENCOUNTER — Ambulatory Visit: Payer: Medicare Other | Admitting: Neurology

## 2014-05-16 ENCOUNTER — Emergency Department (HOSPITAL_BASED_OUTPATIENT_CLINIC_OR_DEPARTMENT_OTHER)
Admission: EM | Admit: 2014-05-16 | Discharge: 2014-05-16 | Disposition: A | Discharge status: Home / Self Care | Payer: Medicare Other | Attending: Emergency Medicine | Admitting: Emergency Medicine

## 2014-05-16 ENCOUNTER — Emergency Department (HOSPITAL_BASED_OUTPATIENT_CLINIC_OR_DEPARTMENT_OTHER): Payer: Medicare Other

## 2014-05-16 ENCOUNTER — Encounter (HOSPITAL_BASED_OUTPATIENT_CLINIC_OR_DEPARTMENT_OTHER): Payer: Self-pay

## 2014-05-16 DIAGNOSIS — E119 Type 2 diabetes mellitus without complications: Secondary | ICD-10-CM | POA: Insufficient documentation

## 2014-05-16 DIAGNOSIS — Z880 Allergy status to penicillin: Secondary | ICD-10-CM | POA: Diagnosis not present

## 2014-05-16 DIAGNOSIS — I10 Essential (primary) hypertension: Secondary | ICD-10-CM | POA: Insufficient documentation

## 2014-05-16 DIAGNOSIS — G4730 Sleep apnea, unspecified: Secondary | ICD-10-CM | POA: Diagnosis not present

## 2014-05-16 DIAGNOSIS — M1990 Unspecified osteoarthritis, unspecified site: Secondary | ICD-10-CM | POA: Diagnosis not present

## 2014-05-16 DIAGNOSIS — F329 Major depressive disorder, single episode, unspecified: Secondary | ICD-10-CM | POA: Insufficient documentation

## 2014-05-16 DIAGNOSIS — I2510 Atherosclerotic heart disease of native coronary artery without angina pectoris: Secondary | ICD-10-CM | POA: Diagnosis not present

## 2014-05-16 DIAGNOSIS — R059 Cough, unspecified: Secondary | ICD-10-CM

## 2014-05-16 DIAGNOSIS — Z79899 Other long term (current) drug therapy: Secondary | ICD-10-CM | POA: Diagnosis not present

## 2014-05-16 DIAGNOSIS — Z87891 Personal history of nicotine dependence: Secondary | ICD-10-CM | POA: Insufficient documentation

## 2014-05-16 DIAGNOSIS — Z85850 Personal history of malignant neoplasm of thyroid: Secondary | ICD-10-CM | POA: Insufficient documentation

## 2014-05-16 DIAGNOSIS — R05 Cough: Secondary | ICD-10-CM | POA: Diagnosis present

## 2014-05-16 DIAGNOSIS — Z791 Long term (current) use of non-steroidal anti-inflammatories (NSAID): Secondary | ICD-10-CM | POA: Insufficient documentation

## 2014-05-16 DIAGNOSIS — F419 Anxiety disorder, unspecified: Secondary | ICD-10-CM | POA: Diagnosis not present

## 2014-05-16 DIAGNOSIS — J159 Unspecified bacterial pneumonia: Secondary | ICD-10-CM | POA: Insufficient documentation

## 2014-05-16 DIAGNOSIS — Z7951 Long term (current) use of inhaled steroids: Secondary | ICD-10-CM | POA: Insufficient documentation

## 2014-05-16 DIAGNOSIS — J449 Chronic obstructive pulmonary disease, unspecified: Secondary | ICD-10-CM | POA: Diagnosis not present

## 2014-05-16 DIAGNOSIS — J189 Pneumonia, unspecified organism: Secondary | ICD-10-CM

## 2014-05-16 DIAGNOSIS — E559 Vitamin D deficiency, unspecified: Secondary | ICD-10-CM | POA: Diagnosis not present

## 2014-05-16 LAB — CBC WITH DIFFERENTIAL/PLATELET
Basophils Absolute: 0 10*3/uL (ref 0.0–0.1)
Basophils Relative: 0 % (ref 0–1)
EOS ABS: 0.3 10*3/uL (ref 0.0–0.7)
Eosinophils Relative: 3 % (ref 0–5)
HEMATOCRIT: 38.7 % (ref 36.0–46.0)
Hemoglobin: 12.9 g/dL (ref 12.0–15.0)
LYMPHS PCT: 32 % (ref 12–46)
Lymphs Abs: 3 10*3/uL (ref 0.7–4.0)
MCH: 31.9 pg (ref 26.0–34.0)
MCHC: 33.3 g/dL (ref 30.0–36.0)
MCV: 95.8 fL (ref 78.0–100.0)
Monocytes Absolute: 0.8 10*3/uL (ref 0.1–1.0)
Monocytes Relative: 9 % (ref 3–12)
Neutro Abs: 5.2 10*3/uL (ref 1.7–7.7)
Neutrophils Relative %: 56 % (ref 43–77)
Platelets: 242 10*3/uL (ref 150–400)
RBC: 4.04 MIL/uL (ref 3.87–5.11)
RDW: 12.8 % (ref 11.5–15.5)
WBC: 9.3 10*3/uL (ref 4.0–10.5)

## 2014-05-16 LAB — BASIC METABOLIC PANEL
Anion gap: 7 (ref 5–15)
BUN: 21 mg/dL (ref 6–23)
CALCIUM: 9.3 mg/dL (ref 8.4–10.5)
CO2: 32 mmol/L (ref 19–32)
Chloride: 98 mEq/L (ref 96–112)
Creatinine, Ser: 0.53 mg/dL (ref 0.50–1.10)
GFR calc Af Amer: >90 mL/min (ref >90)
Glucose, Bld: 197 mg/dL — ABNORMAL HIGH (ref 70–99)
Potassium: 4.4 mmol/L (ref 3.5–5.1)
Sodium: 137 mmol/L (ref 135–145)

## 2014-05-16 LAB — TROPONIN I: Troponin I: <0.03 ng/mL (ref <0.031)

## 2014-05-16 MED ORDER — LEVOFLOXACIN 750 MG PO TABS
750.0000 mg | ORAL_TABLET | Freq: Every day | ORAL | Status: AC
Start: 2014-05-16 — End: ?

## 2014-05-16 MED ORDER — LEVOFLOXACIN 750 MG PO TABS
750.0000 mg | ORAL_TABLET | Freq: Every day | ORAL | Status: DC
  Administered 2014-05-16: 750 mg via ORAL
  Filled 2014-05-16: qty 1

## 2014-05-16 MED ORDER — GI COCKTAIL ~~LOC~~
30.0000 mL | Freq: Once | ORAL | Status: AC
  Administered 2014-05-16: 30 mL via ORAL
  Filled 2014-05-16: qty 30

## 2014-05-16 NOTE — ED Provider Notes (Signed)
CSN: [id number]     Arrival date & time 05/16/14  1226 History   First MD Initiated Contact with Patient 05/16/14 1245     Chief Complaint  Patient presents with  . Cough     (Consider location/radiation/quality/duration/timing/severity/associated sxs/prior Treatment) HPI Comments: Pt comes in today with non productive cough times 3 weeks. Pt states that she woke up with a burning in her chest last night that was consistent with her reflux and it hasn't gone away so she decided to come in today. Denies cp or sob. States that she is oxygen at night. No fever, n/v, abdominal pain.  The history is provided by the patient. No language interpreter was used.    Past Medical History  Diagnosis Date  . Diabetes   . ASCVD (arteriosclerotic cardiovascular disease)   . COPD (chronic obstructive pulmonary disease)   . Sleep apnea   . Vitamin D deficiency   . History of thyroid cancer   . Hypertension   . Anxiety   . Arthritis   . Depression    Past Surgical History  Procedure Laterality Date  . Replacement total knee Right 2010  . Tonsillectomy    . Cholecystectomy    . Abdominal hysterectomy    . Appendectomy    . Brain surgery      meningioma of cerebellum  . Eye surgery     No family history on file. History  Substance Use Topics  . Smoking status: Former Smoker    Quit date: 11/13/1997  . Smokeless tobacco: Never Used  . Alcohol Use: No   OB History    No data available     Review of Systems  All other systems reviewed and are negative.     Allergies  Aminoglycosides; Amoxicillin; Bactrim; Ceclor; Doxycycline; Erythromycin; Gentamycin; Ibuprofen; Lipitor; Metformin and related; Penicillins; Pravastatin; Sulfa antibiotics; Tetracyclines & related; Vasocon; and Zocor  Home Medications   Prior to Admission medications   Medication Sig Start Date End Date Taking? Authorizing Provider  albuterol (PROVENTIL HFA;VENTOLIN HFA) 108 (90 BASE) MCG/ACT inhaler Inhale 1-2  puffs into the lungs every 4 (four) hours as needed for wheezing or shortness of breath.    Historical Provider, MD  ALPRAZolam Duanne Moron) 1 MG tablet Take 1 mg by mouth 4 (four) times daily as needed.     Historical Provider, MD  atorvastatin (LIPITOR) 20 MG tablet Take 20 mg by mouth daily.    Historical Provider, MD  baclofen (LIORESAL) 10 MG tablet Take 10 mg by mouth 4 (four) times daily.    Historical Provider, MD  carbidopa-levodopa (PARCOPA) 25-100 MG per disintegrating tablet Take 1 tablet by mouth 3 (three) times daily.    Historical Provider, MD  carbidopa-levodopa (SINEMET IR) 25-100 MG per tablet Take 1 tablet by mouth 3 (three) times daily.    Historical Provider, MD  celecoxib (CELEBREX) 200 MG capsule Take 200 mg by mouth 2 (two) times daily.    Historical Provider, MD  Cholecalciferol (VITAMIN D-3 PO) Take 2,000 Units by mouth daily.    Historical Provider, MD  citalopram (CELEXA) 20 MG tablet Take 20 mg by mouth daily.    Historical Provider, MD  Coenzyme Q10 (CO Q 10 PO) Take by mouth daily.    Historical Provider, MD  dexlansoprazole (DEXILANT) 60 MG capsule Take 60 mg by mouth daily.    Historical Provider, MD  donepezil (ARICEPT) 10 MG tablet Take 10 mg by mouth 2 (two) times daily.    Historical Provider, MD  DULoxetine (CYMBALTA) 60 MG capsule Take 120 mg by mouth at bedtime.     Historical Provider, MD  fentaNYL (DURAGESIC - DOSED MCG/HR) 25 MCG/HR patch Place 25 mcg onto the skin every 3 (three) days.    Historical Provider, MD  Fluticasone-Salmeterol (ADVAIR) 250-50 MCG/DOSE AEPB Inhale 1 puff into the lungs 2 (two) times daily.    Historical Provider, MD  glipiZIDE (GLUCOTROL) 10 MG tablet Take 10 mg by mouth 2 (two) times daily before a meal.    Historical Provider, MD  hydrochlorothiazide (HYDRODIURIL) 25 MG tablet Take 25 mg by mouth daily.    Historical Provider, MD  HYDROmorphone (DILAUDID) 2 MG tablet Take 2 mg by mouth every 4 (four) hours as needed for severe pain.     Historical Provider, MD  HYDROmorphone (DILAUDID) 4 MG tablet Take 4 mg by mouth every 6 (six) hours as needed for severe pain.    Historical Provider, MD  iron polysaccharides (NIFEREX) 150 MG capsule Take 150 mg by mouth at bedtime.    Historical Provider, MD  lamoTRIgine (LAMICTAL) 25 MG tablet Take 25 mg by mouth daily.    Historical Provider, MD  levothyroxine (SYNTHROID, LEVOTHROID) 100 MCG tablet Take 100 mcg by mouth daily before breakfast.    Historical Provider, MD  levothyroxine (SYNTHROID, LEVOTHROID) 137 MCG tablet Take 137 mcg by mouth daily before breakfast.    Historical Provider, MD  levothyroxine (SYNTHROID, LEVOTHROID) 200 MCG tablet Take 200 mcg by mouth daily before breakfast.    Historical Provider, MD  magnesium oxide (MAG-OX) 400 MG tablet Take 400 mg by mouth 2 (two) times daily.     Historical Provider, MD  metoprolol (LOPRESSOR) 50 MG tablet Take 50 mg by mouth 2 (two) times daily.    Historical Provider, MD  Multiple Vitamin (MULTIVITAMIN) tablet Take 1 tablet by mouth daily.    Historical Provider, MD  ondansetron (ZOFRAN-ODT) 4 MG disintegrating tablet Take 4 mg by mouth every 6 (six) hours.    Historical Provider, MD  phenytoin (DILANTIN) 100 MG ER capsule Take by mouth 3 (three) times daily.    Historical Provider, MD  phenytoin (DILANTIN) 200 MG ER capsule Take 200 mg by mouth 3 (three) times daily - between meals and at bedtime. 2 tabs in AM and 1 tab at HS    Historical Provider, MD  potassium chloride (K-DUR) 10 MEQ tablet Take 10 mEq by mouth 2 (two) times daily.     Historical Provider, MD  pregabalin (LYRICA) 300 MG capsule Take 300 mg by mouth 2 (two) times daily.    Historical Provider, MD  promethazine (PHENERGAN) 25 MG tablet Take 25 mg by mouth every 6 (six) hours as needed for nausea or vomiting.    Historical Provider, MD  RABEprazole (ACIPHEX) 20 MG tablet Take 20 mg by mouth daily.    Historical Provider, MD  ranitidine (ZANTAC) 150 MG tablet Take 150  mg by mouth 2 (two) times daily.    Historical Provider, MD  risperiDONE (RISPERDAL) 2 MG tablet Take 2 mg by mouth at bedtime.    Historical Provider, MD  tiotropium (SPIRIVA) 18 MCG inhalation capsule Place 18 mcg into inhaler and inhale daily.    Historical Provider, MD  tiZANidine (ZANAFLEX) 4 MG tablet Take 4 mg by mouth every 6 (six) hours as needed for muscle spasms.    Historical Provider, MD  venlafaxine (EFFEXOR) 100 MG tablet Take 100 mg by mouth 3 (three) times daily.    Historical Provider, MD  BP 110/42 mmHg  Pulse 78  Temp(Src) 98.6 F (37 C) (Oral)  Resp 22  Ht 5\' 5"  (1.651 m)  Wt 212 lb (96.163 kg)  BMI 35.28 kg/m2  SpO2 94% Physical Exam  Constitutional: She is oriented to person, place, and time. She appears well-developed and well-nourished.  HENT:  Head: Normocephalic and atraumatic.  Cardiovascular: Normal rate and regular rhythm.   Pulmonary/Chest: Effort normal. She has rales.  Abdominal: Bowel sounds are normal.  Musculoskeletal: Normal range of motion.  Neurological: She is alert and oriented to person, place, and time.  Skin: Skin is warm and dry.  Psychiatric: She has a normal mood and affect.  Nursing note and vitals reviewed.   ED Course  Procedures (including critical care time) Labs Review Labs Reviewed  BASIC METABOLIC PANEL - Abnormal; Notable for the following:    Glucose, Bld 197 (*)    All other components within normal limits  TROPONIN I  CBC WITH DIFFERENTIAL    Imaging Review Dg Chest 2 View  05/16/2014   CLINICAL DATA:  4 week history of cough associated with burning in the chest. Current history of COPD, diabetes and hypertension.  EXAM: CHEST  2 VIEW  COMPARISON:  N/ 08/2013, 12/12/2013.  CT chest 05/13/2012.  FINDINGS: Cardiomediastinal silhouette unremarkable, unchanged. Minimal focal airspace opacity in the right middle lobe. Lungs otherwise clear. Bronchovascular markings normal. No pleural effusions. No pneumothorax.  Degenerative changes and DISH involving the thoracic spine.  IMPRESSION: Minimal bronchopneumonia involving the right middle lobe.   Electronically Signed   By: Evangeline Dakin M.D.   On: 05/16/2014 13:16     EKG Interpretation   Date/Time:  Friday May 16 2014 13:08:31 EST Ventricular Rate:  76 PR Interval:  200 QRS Duration: 78 QT Interval:  382 QTC Calculation: 429 R Axis:   26 Text Interpretation:  Normal sinus rhythm Septal infarct , age  undetermined Abnormal ECG Since previous tracing rate is improved  Confirmed by Canary Brim  MD, MARTHA [phone]) on 05/16/2014 1:42:11 PM      MDM   Final diagnoses:  Cough  CAP (community acquired pneumonia)    Pt having no more burning after the gi cocktail. Pt vital are stable. Will send home of levaquin. Discussed return precautions with pt and husband    Glendell Docker, NP 05/16/14 Center, MD 05/16/14 [phone]

## 2014-05-16 NOTE — Discharge Instructions (Signed)
Follow up with your doctor or return here for continued or worsening symptoms. Pneumonia Pneumonia is an infection of the lungs.  CAUSES Pneumonia may be caused by bacteria or a virus. Usually, these infections are caused by breathing infectious particles into the lungs (respiratory tract). SIGNS AND SYMPTOMS   Cough.  Fever.  Chest pain.  Increased rate of breathing.  Wheezing.  Mucus production. DIAGNOSIS  If you have the common symptoms of pneumonia, your health care provider will typically confirm the diagnosis with a chest X-ray. The X-ray will show an abnormality in the lung (pulmonary infiltrate) if you have pneumonia. Other tests of your blood, urine, or sputum may be done to find the specific cause of your pneumonia. Your health care provider may also do tests (blood gases or pulse oximetry) to see how well your lungs are working. TREATMENT  Some forms of pneumonia may be spread to other people when you cough or sneeze. You may be asked to wear a mask before and during your exam. Pneumonia that is caused by bacteria is treated with antibiotic medicine. Pneumonia that is caused by the influenza virus may be treated with an antiviral medicine. Most other viral infections must run their course. These infections will not respond to antibiotics.  HOME CARE INSTRUCTIONS   Cough suppressants may be used if you are losing too much rest. However, coughing protects you by clearing your lungs. You should avoid using cough suppressants if you can.  Your health care provider may have prescribed medicine if he or she thinks your pneumonia is caused by bacteria or influenza. Finish your medicine even if you start to feel better.  Your health care provider may also prescribe an expectorant. This loosens the mucus to be coughed up.  Take medicines only as directed by your health care provider.  Do not smoke. Smoking is a common cause of bronchitis and can contribute to pneumonia. If you are a  smoker and continue to smoke, your cough may last several weeks after your pneumonia has cleared.  A cold steam vaporizer or humidifier in your room or home may help loosen mucus.  Coughing is often worse at night. Sleeping in a semi-upright position in a recliner or using a couple pillows under your head will help with this.  Get rest as you feel it is needed. Your body will usually let you know when you need to rest. PREVENTION A pneumococcal shot (vaccine) is available to prevent a common bacterial cause of pneumonia. This is usually suggested for:  People over 74 years old.  Patients on chemotherapy.  People with chronic lung problems, such as bronchitis or emphysema.  People with immune system problems. If you are over 65 or have a high risk condition, you may receive the pneumococcal vaccine if you have not received it before. In some countries, a routine influenza vaccine is also recommended. This vaccine can help prevent some cases of pneumonia.You may be offered the influenza vaccine as part of your care. If you smoke, it is time to quit. You may receive instructions on how to stop smoking. Your health care provider can provide medicines and counseling to help you quit. SEEK MEDICAL CARE IF: You have a fever. SEEK IMMEDIATE MEDICAL CARE IF:   Your illness becomes worse. This is especially true if you are elderly or weakened from any other disease.  You cannot control your cough with suppressants and are losing sleep.  You begin coughing up blood.  You develop pain which  is getting worse or is uncontrolled with medicines.  Any of the symptoms which initially brought you in for treatment are getting worse rather than better.  You develop shortness of breath or chest pain. MAKE SURE YOU:   Understand these instructions.  Will watch your condition.  Will get help right away if you are not doing well or get worse. Document Released: 05/02/2005 Document Revised:  09/16/2013 Document Reviewed: 07/22/2010 [facility] Patient Information 2015 [place], [place]. This information is not intended to replace advice given to you by your health care provider. Make sure you discuss any questions you have with your health care provider.

## 2014-05-16 NOTE — ED Notes (Signed)
Non productive cough x 3 weeks. Wears oxygen at night

## 2014-06-04 ENCOUNTER — Ambulatory Visit: Payer: Medicare Other

## 2014-06-06 ENCOUNTER — Ambulatory Visit: Payer: Medicare Other | Admitting: Rehabilitation

## 2014-06-10 ENCOUNTER — Ambulatory Visit: Payer: Medicare Other | Attending: Orthopedic Surgery | Admitting: Rehabilitation

## 2014-06-10 DIAGNOSIS — M25551 Pain in right hip: Secondary | ICD-10-CM | POA: Insufficient documentation

## 2014-06-10 DIAGNOSIS — M170 Bilateral primary osteoarthritis of knee: Secondary | ICD-10-CM | POA: Diagnosis not present

## 2014-06-10 DIAGNOSIS — M7061 Trochanteric bursitis, right hip: Secondary | ICD-10-CM | POA: Insufficient documentation

## 2014-06-10 DIAGNOSIS — R262 Difficulty in walking, not elsewhere classified: Secondary | ICD-10-CM | POA: Insufficient documentation

## 2014-06-10 DIAGNOSIS — M25661 Stiffness of right knee, not elsewhere classified: Secondary | ICD-10-CM | POA: Insufficient documentation

## 2014-06-18 ENCOUNTER — Ambulatory Visit: Payer: Medicare Other | Attending: Orthopedic Surgery | Admitting: Rehabilitation

## 2014-06-18 DIAGNOSIS — M7061 Trochanteric bursitis, right hip: Secondary | ICD-10-CM | POA: Insufficient documentation

## 2014-06-18 DIAGNOSIS — M170 Bilateral primary osteoarthritis of knee: Secondary | ICD-10-CM | POA: Insufficient documentation

## 2014-06-18 DIAGNOSIS — R262 Difficulty in walking, not elsewhere classified: Secondary | ICD-10-CM | POA: Insufficient documentation

## 2014-06-18 DIAGNOSIS — M25661 Stiffness of right knee, not elsewhere classified: Secondary | ICD-10-CM | POA: Insufficient documentation

## 2014-06-18 DIAGNOSIS — M25551 Pain in right hip: Secondary | ICD-10-CM | POA: Insufficient documentation

## 2014-06-19 ENCOUNTER — Emergency Department (HOSPITAL_BASED_OUTPATIENT_CLINIC_OR_DEPARTMENT_OTHER)
Admission: EM | Admit: 2014-06-19 | Discharge: 2014-06-19 | Disposition: A | Discharge status: Home / Self Care | Payer: Medicare Other | Attending: Emergency Medicine | Admitting: Emergency Medicine

## 2014-06-19 ENCOUNTER — Ambulatory Visit: Payer: Medicare Other | Admitting: Rehabilitation

## 2014-06-19 DIAGNOSIS — Z85850 Personal history of malignant neoplasm of thyroid: Secondary | ICD-10-CM | POA: Insufficient documentation

## 2014-06-19 DIAGNOSIS — I10 Essential (primary) hypertension: Secondary | ICD-10-CM | POA: Insufficient documentation

## 2014-06-19 DIAGNOSIS — Z7951 Long term (current) use of inhaled steroids: Secondary | ICD-10-CM | POA: Diagnosis not present

## 2014-06-19 DIAGNOSIS — Z880 Allergy status to penicillin: Secondary | ICD-10-CM | POA: Insufficient documentation

## 2014-06-19 DIAGNOSIS — E559 Vitamin D deficiency, unspecified: Secondary | ICD-10-CM | POA: Diagnosis not present

## 2014-06-19 DIAGNOSIS — E119 Type 2 diabetes mellitus without complications: Secondary | ICD-10-CM | POA: Diagnosis not present

## 2014-06-19 DIAGNOSIS — J449 Chronic obstructive pulmonary disease, unspecified: Secondary | ICD-10-CM | POA: Diagnosis not present

## 2014-06-19 DIAGNOSIS — T50905A Adverse effect of unspecified drugs, medicaments and biological substances, initial encounter: Secondary | ICD-10-CM

## 2014-06-19 DIAGNOSIS — Z792 Long term (current) use of antibiotics: Secondary | ICD-10-CM | POA: Insufficient documentation

## 2014-06-19 DIAGNOSIS — M1990 Unspecified osteoarthritis, unspecified site: Secondary | ICD-10-CM | POA: Insufficient documentation

## 2014-06-19 DIAGNOSIS — F329 Major depressive disorder, single episode, unspecified: Secondary | ICD-10-CM | POA: Insufficient documentation

## 2014-06-19 DIAGNOSIS — T39315A Adverse effect of propionic acid derivatives, initial encounter: Secondary | ICD-10-CM | POA: Insufficient documentation

## 2014-06-19 DIAGNOSIS — Z87891 Personal history of nicotine dependence: Secondary | ICD-10-CM | POA: Diagnosis not present

## 2014-06-19 DIAGNOSIS — F419 Anxiety disorder, unspecified: Secondary | ICD-10-CM

## 2014-06-19 DIAGNOSIS — Z8669 Personal history of other diseases of the nervous system and sense organs: Secondary | ICD-10-CM | POA: Diagnosis not present

## 2014-06-19 DIAGNOSIS — Z79891 Long term (current) use of opiate analgesic: Secondary | ICD-10-CM | POA: Insufficient documentation

## 2014-06-19 LAB — CBC WITH DIFFERENTIAL/PLATELET
BASOS PCT: 0 % (ref 0–1)
Basophils Absolute: 0 10*3/uL (ref 0.0–0.1)
EOS ABS: 0.2 10*3/uL (ref 0.0–0.7)
Eosinophils Relative: 2 % (ref 0–5)
HCT: 41.5 % (ref 36.0–46.0)
Hemoglobin: 14.4 g/dL (ref 12.0–15.0)
Lymphocytes Relative: 24 % (ref 12–46)
Lymphs Abs: 2.6 10*3/uL (ref 0.7–4.0)
MCH: 32.3 pg (ref 26.0–34.0)
MCHC: 34.7 g/dL (ref 30.0–36.0)
MCV: 93 fL (ref 78.0–100.0)
MONOS PCT: 8 % (ref 3–12)
Monocytes Absolute: 0.9 10*3/uL (ref 0.1–1.0)
NEUTROS ABS: 7.3 10*3/uL (ref 1.7–7.7)
Neutrophils Relative %: 66 % (ref 43–77)
PLATELETS: 257 10*3/uL (ref 150–400)
RBC: 4.46 MIL/uL (ref 3.87–5.11)
RDW: 13.2 % (ref 11.5–15.5)
WBC: 11 10*3/uL — ABNORMAL HIGH (ref 4.0–10.5)

## 2014-06-19 LAB — URINALYSIS, ROUTINE W REFLEX MICROSCOPIC
Glucose, UA: NEGATIVE mg/dL
Hgb urine dipstick: NEGATIVE
KETONES UR: NEGATIVE mg/dL
Leukocytes, UA: NEGATIVE
Nitrite: NEGATIVE
Protein, ur: 100 mg/dL — AB
SPECIFIC GRAVITY, URINE: 1.02 (ref 1.005–1.030)
Urobilinogen, UA: 0.2 mg/dL (ref 0.0–1.0)
pH: 6 (ref 5.0–8.0)

## 2014-06-19 LAB — BASIC METABOLIC PANEL
ANION GAP: 8 (ref 5–15)
BUN: 18 mg/dL (ref 6–23)
CHLORIDE: 96 mmol/L (ref 96–112)
CO2: 30 mmol/L (ref 19–32)
Calcium: 9.3 mg/dL (ref 8.4–10.5)
Creatinine, Ser: 0.48 mg/dL — ABNORMAL LOW (ref 0.50–1.10)
GFR calc Af Amer: >90 mL/min (ref >90)
GFR calc non Af Amer: >90 mL/min (ref >90)
GLUCOSE: 242 mg/dL — AB (ref 70–99)
Potassium: 3.8 mmol/L (ref 3.5–5.1)
SODIUM: 134 mmol/L — AB (ref 135–145)

## 2014-06-19 LAB — TROPONIN I: Troponin I: <0.03 ng/mL (ref <0.031)

## 2014-06-19 LAB — URINE MICROSCOPIC-ADD ON

## 2014-06-19 MED ORDER — LORAZEPAM 1 MG PO TABS
0.5000 mg | ORAL_TABLET | Freq: Once | ORAL | Status: AC
  Administered 2014-06-19: 0.5 mg via ORAL
  Filled 2014-06-19: qty 1

## 2014-06-19 NOTE — ED Provider Notes (Signed)
CSN: [id number]     Arrival date & time 06/19/14  0300 History   First MD Initiated Contact with Patient 06/19/14 [phone]     Chief Complaint  Patient presents with  . Anxiety   HPI Last night the patient put on a fentanyl patch and took an ibuprofen.  She had been on fentanyl in the past past but on a lower dose at 25 mcg.   Pt started itching and felt hot all over.  She took off the patch but continued to feel poorly.  She is afraid she is going to die because initially her pain doctor was  prescribed 25 mcg, she said she needed something stronger.  She asked for 100 mcg but he said that dose would kill her.  She had some 100 left over at home and decided to take it on her own last night.    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  She sees pain mangement for that.   No cp or shortness of breath.  No fevers or chilled.  She is feeling flushed and hot all over.    Past Medical History  Diagnosis Date  . Diabetes   . ASCVD (arteriosclerotic cardiovascular disease)   . COPD (chronic obstructive pulmonary disease)   . Sleep apnea   . Vitamin D deficiency   . History of thyroid cancer   . Hypertension   . Anxiety   . Arthritis   . Depression    Past Surgical History  Procedure Laterality Date  . Replacement total knee Right 2010  . Tonsillectomy    . Cholecystectomy    . Abdominal hysterectomy    . Appendectomy    . Brain surgery      meningioma of cerebellum  . Eye surgery     No family history on file. History  Substance Use Topics  . Smoking status: Former Smoker    Quit date: 11/13/1997  . Smokeless tobacco: Never Used  . Alcohol Use: No   OB History    No data available     Review of Systems  Constitutional: Negative for fever.  Respiratory: Negative for wheezing.   Cardiovascular: Negative for chest pain.  Gastrointestinal: Negative for vomiting, abdominal pain and diarrhea.  Genitourinary: Negative for dysuria.  Neurological: Negative for  headaches.  Psychiatric/Behavioral: The patient is nervous/anxious.   All other systems reviewed and are negative.     Allergies  Aminoglycosides; Amoxicillin; Bactrim; Ceclor; Doxycycline; Erythromycin; Gentamycin; Ibuprofen; Lipitor; Metformin and related; Penicillins; Pravastatin; Sulfa antibiotics; Tetracyclines & related; Vasocon; and Zocor  Home Medications   Prior to Admission medications   Medication Sig Start Date End Date Taking? Authorizing Provider  albuterol (PROVENTIL HFA;VENTOLIN HFA) 108 (90 BASE) MCG/ACT inhaler Inhale 1-2 puffs into the lungs every 4 (four) hours as needed for wheezing or shortness of breath.    Historical Provider, MD  ALPRAZolam Duanne Moron) 1 MG tablet Take 1 mg by mouth 4 (four) times daily as needed.     Historical Provider, MD  atorvastatin (LIPITOR) 20 MG tablet Take 20 mg by mouth daily.    Historical Provider, MD  baclofen (LIORESAL) 10 MG tablet Take 10 mg by mouth 4 (four) times daily.    Historical Provider, MD  carbidopa-levodopa (PARCOPA) 25-100 MG per disintegrating tablet Take 1 tablet by mouth 3 (three) times daily.    Historical Provider, MD  carbidopa-levodopa (SINEMET IR) 25-100 MG per tablet Take 1 tablet by mouth 3 (three) times daily.  Historical Provider, MD  celecoxib (CELEBREX) 200 MG capsule Take 200 mg by mouth 2 (two) times daily.    Historical Provider, MD  Cholecalciferol (VITAMIN D-3 PO) Take 2,000 Units by mouth daily.    Historical Provider, MD  citalopram (CELEXA) 20 MG tablet Take 20 mg by mouth daily.    Historical Provider, MD  Coenzyme Q10 (CO Q 10 PO) Take by mouth daily.    Historical Provider, MD  dexlansoprazole (DEXILANT) 60 MG capsule Take 60 mg by mouth daily.    Historical Provider, MD  donepezil (ARICEPT) 10 MG tablet Take 10 mg by mouth 2 (two) times daily.    Historical Provider, MD  DULoxetine (CYMBALTA) 60 MG capsule Take 120 mg by mouth at bedtime.     Historical Provider, MD  fentaNYL (DURAGESIC - DOSED  MCG/HR) 25 MCG/HR patch Place 25 mcg onto the skin every 3 (three) days.    Historical Provider, MD  Fluticasone-Salmeterol (ADVAIR) 250-50 MCG/DOSE AEPB Inhale 1 puff into the lungs 2 (two) times daily.    Historical Provider, MD  glipiZIDE (GLUCOTROL) 10 MG tablet Take 10 mg by mouth 2 (two) times daily before a meal.    Historical Provider, MD  hydrochlorothiazide (HYDRODIURIL) 25 MG tablet Take 25 mg by mouth daily.    Historical Provider, MD  HYDROmorphone (DILAUDID) 2 MG tablet Take 2 mg by mouth every 4 (four) hours as needed for severe pain.    Historical Provider, MD  HYDROmorphone (DILAUDID) 4 MG tablet Take 4 mg by mouth every 6 (six) hours as needed for severe pain.    Historical Provider, MD  iron polysaccharides (NIFEREX) 150 MG capsule Take 150 mg by mouth at bedtime.    Historical Provider, MD  lamoTRIgine (LAMICTAL) 25 MG tablet Take 25 mg by mouth daily.    Historical Provider, MD  levofloxacin (LEVAQUIN) 750 MG tablet Take 1 tablet (750 mg total) by mouth daily. 05/16/14   Glendell Docker, NP  levothyroxine (SYNTHROID, LEVOTHROID) 100 MCG tablet Take 100 mcg by mouth daily before breakfast.    Historical Provider, MD  levothyroxine (SYNTHROID, LEVOTHROID) 137 MCG tablet Take 137 mcg by mouth daily before breakfast.    Historical Provider, MD  levothyroxine (SYNTHROID, LEVOTHROID) 200 MCG tablet Take 200 mcg by mouth daily before breakfast.    Historical Provider, MD  magnesium oxide (MAG-OX) 400 MG tablet Take 400 mg by mouth 2 (two) times daily.     Historical Provider, MD  metoprolol (LOPRESSOR) 50 MG tablet Take 50 mg by mouth 2 (two) times daily.    Historical Provider, MD  Multiple Vitamin (MULTIVITAMIN) tablet Take 1 tablet by mouth daily.    Historical Provider, MD  ondansetron (ZOFRAN-ODT) 4 MG disintegrating tablet Take 4 mg by mouth every 6 (six) hours.    Historical Provider, MD  phenytoin (DILANTIN) 100 MG ER capsule Take by mouth 3 (three) times daily.    Historical  Provider, MD  phenytoin (DILANTIN) 200 MG ER capsule Take 200 mg by mouth 3 (three) times daily - between meals and at bedtime. 2 tabs in AM and 1 tab at HS    Historical Provider, MD  potassium chloride (K-DUR) 10 MEQ tablet Take 10 mEq by mouth 2 (two) times daily.     Historical Provider, MD  pregabalin (LYRICA) 300 MG capsule Take 300 mg by mouth 2 (two) times daily.    Historical Provider, MD  promethazine (PHENERGAN) 25 MG tablet Take 25 mg by mouth every 6 (six) hours as  needed for nausea or vomiting.    Historical Provider, MD  RABEprazole (ACIPHEX) 20 MG tablet Take 20 mg by mouth daily.    Historical Provider, MD  ranitidine (ZANTAC) 150 MG tablet Take 150 mg by mouth 2 (two) times daily.    Historical Provider, MD  risperiDONE (RISPERDAL) 2 MG tablet Take 2 mg by mouth at bedtime.    Historical Provider, MD  tiotropium (SPIRIVA) 18 MCG inhalation capsule Place 18 mcg into inhaler and inhale daily.    Historical Provider, MD  tiZANidine (ZANAFLEX) 4 MG tablet Take 4 mg by mouth every 6 (six) hours as needed for muscle spasms.    Historical Provider, MD  venlafaxine (EFFEXOR) 100 MG tablet Take 100 mg by mouth 3 (three) times daily.    Historical Provider, MD   BP 138/93 mmHg  Pulse 115  Temp(Src) 98.7 F (37.1 C) (Oral)  Resp 20  SpO2 96% Physical Exam  Constitutional: She appears well-developed and well-nourished. No distress.  HENT:  Head: Normocephalic and atraumatic.  Right Ear: External ear normal.  Left Ear: External ear normal.  Eyes: Conjunctivae are normal. Right eye exhibits no discharge. Left eye exhibits no discharge. No scleral icterus.  Neck: Neck supple. No tracheal deviation present.  Cardiovascular: Normal rate, regular rhythm and intact distal pulses.   Pulmonary/Chest: Effort normal and breath sounds normal. No stridor. No respiratory distress. She has no wheezes. She has no rales.  Abdominal: Soft. Bowel sounds are normal. She exhibits no distension. There is  no tenderness. There is no rebound and no guarding.  Musculoskeletal: She exhibits no edema or tenderness.  Neurological: She is alert. She has normal strength. No cranial nerve deficit (no facial droop, extraocular movements intact, no slurred speech) or sensory deficit. She exhibits normal muscle tone. She displays no seizure activity. Coordination normal.  Skin: Skin is warm and dry. No rash noted.  Psychiatric: Her mood appears anxious.  Nursing note and vitals reviewed.   ED Course  Procedures (including critical care time) Labs Review Labs Reviewed  CBC WITH DIFFERENTIAL/PLATELET - Abnormal; Notable for the following:    WBC 11.0 (*)    All other components within normal limits  BASIC METABOLIC PANEL - Abnormal; Notable for the following:    Sodium 134 (*)    Glucose, Bld 242 (*)    Creatinine, Ser 0.48 (*)    All other components within normal limits  URINALYSIS, ROUTINE W REFLEX MICROSCOPIC - Abnormal; Notable for the following:    Bilirubin Urine MODERATE (*)    Protein, ur 100 (*)    All other components within normal limits  URINE MICROSCOPIC-ADD ON - Abnormal; Notable for the following:    Squamous Epithelial / LPF FEW (*)    Bacteria, UA FEW (*)    All other components within normal limits  TROPONIN I      EKG Interpretation   Date/Time:  Thursday June 19 2014 09:52:32 EST Ventricular Rate:  110 PR Interval:  182 QRS Duration: 72 QT Interval:  328 QTC Calculation: 443 R Axis:   -13 Text Interpretation:  Sinus tachycardia Abnormal QRS-T angle, consider  primary T wave abnormality Abnormal ECG Since last tracing rate faster  Confirmed by Brodi Nery  MD-J, Bexton Haak ([id number]) on 06/19/2014 10:06:04 AM      MDM   Final diagnoses:  Anxiety  Medication adverse effect, initial encounter    Patient's symptoms may be related to the pain medications versus an anxiety attack. Patient denies any specific complaints of  chest pain, headache, abdominal pain or shortness of  breath. She feels flushed and anxious.  EKG and cardiac enzymes do not suggest acute ischemia. She is not having any shortness of breath or lungs are clear. She does have a mild tachycardia but I think this is related to her anxiety issues.  Laboratory tests shows mild hyperglycemia but otherwise no other significant abnormalities.  Patient was given a dose of Ativan. I recommended close follow-up in 24 hours. She should return to the emergency room if she starts having chest pain shortness of breath or other worsening symptoms.    Dorie Rank, MD 06/19/14 [phone]

## 2014-06-19 NOTE — ED Notes (Signed)
Pt states she took 1mg  xanax around 7am, but does not feel any better. Pt states she is worried that she is going to die because she used one of her old fentanyl 100mg  transdermal patches yesterday, pt states she wore it from approx 8am yesterday until this morning, when she removed it. Pt states that she was told not to use her old patches by a pain doctor "months ago", but she used one anyway.

## 2014-06-20 LAB — URINE CULTURE: Colony Count: 70000

## 2014-06-24 ENCOUNTER — Ambulatory Visit: Payer: Medicare Other | Admitting: Rehabilitation

## 2014-06-24 DIAGNOSIS — M7061 Trochanteric bursitis, right hip: Secondary | ICD-10-CM | POA: Diagnosis present

## 2014-06-24 DIAGNOSIS — M170 Bilateral primary osteoarthritis of knee: Secondary | ICD-10-CM | POA: Diagnosis not present

## 2014-06-24 DIAGNOSIS — R262 Difficulty in walking, not elsewhere classified: Secondary | ICD-10-CM | POA: Diagnosis not present

## 2014-06-24 DIAGNOSIS — M25551 Pain in right hip: Secondary | ICD-10-CM | POA: Diagnosis not present

## 2014-06-24 DIAGNOSIS — M25661 Stiffness of right knee, not elsewhere classified: Secondary | ICD-10-CM | POA: Diagnosis not present

## 2014-06-25 ENCOUNTER — Ambulatory Visit: Payer: Medicare Other | Admitting: Rehabilitation

## 2014-06-26 ENCOUNTER — Ambulatory Visit: Payer: Medicare Other | Admitting: Rehabilitation

## 2014-06-26 DIAGNOSIS — M7061 Trochanteric bursitis, right hip: Secondary | ICD-10-CM | POA: Diagnosis not present

## 2014-06-30 ENCOUNTER — Ambulatory Visit: Payer: Medicare Other | Admitting: Rehabilitation

## 2014-07-02 ENCOUNTER — Ambulatory Visit: Payer: Medicare Other | Admitting: Rehabilitation

## 2014-07-09 ENCOUNTER — Ambulatory Visit: Payer: Medicare Other | Admitting: Rehabilitation

## 2014-07-16 ENCOUNTER — Ambulatory Visit: Payer: Medicare Other | Admitting: Rehabilitation

## 2016-02-07 IMAGING — CT CT HEAD W/O CM
1 series · 16 of 30 positions shown, 20 images · non-contrast
Comparison: 12/12/2013

CLINICAL DATA: Patient fell 2 days ago at home striking forehead on
dresser. Complains of frontal soreness. Altered mental status.

EXAM:
CT HEAD WITHOUT CONTRAST
TECHNIQUE: Contiguous axial images were obtained from the base of the skull
through the vertex without intravenous contrast.

[Series 2: head 4.8 h37s · axial · 0.46mm/px · z∈[-238,-82]mm · 16 of 36 slices shown, 20 images]
[im 2/36  brain]
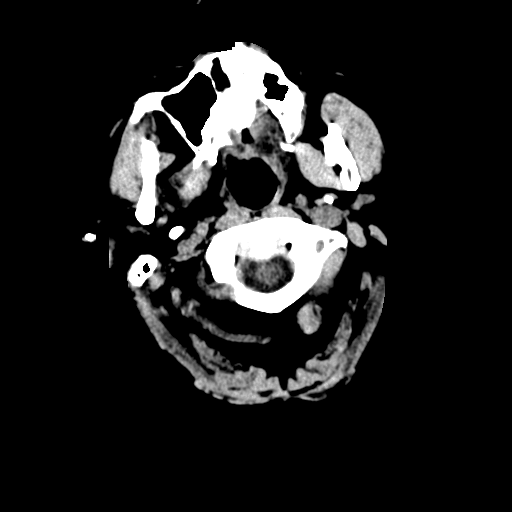
[im 2/36  bone]
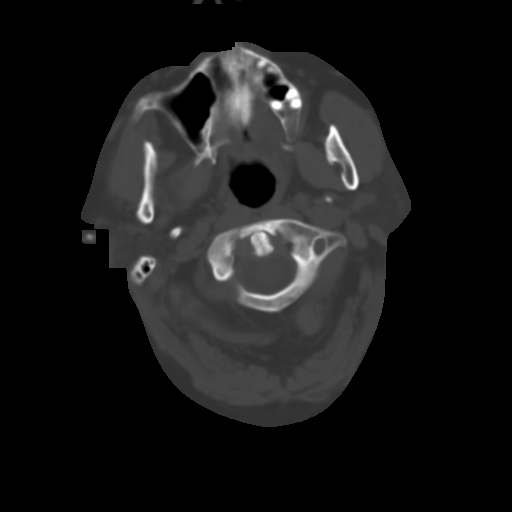
[im 4/36  brain]
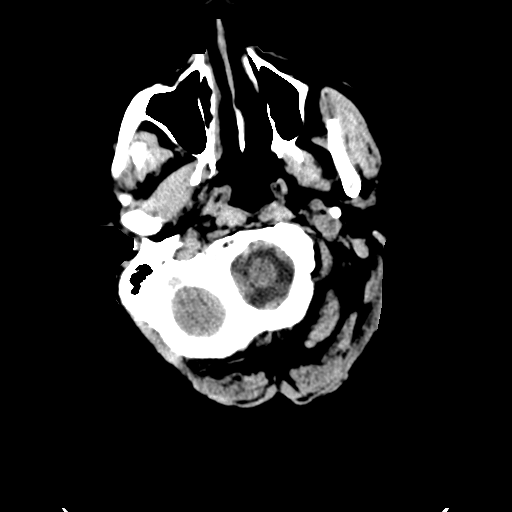
[im 7/36  brain]
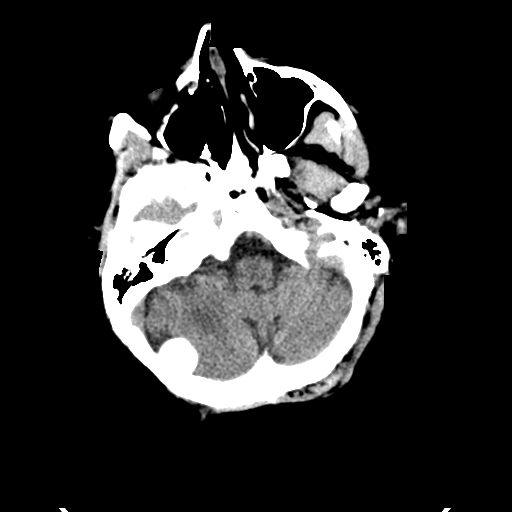
[im 9/36  brain]
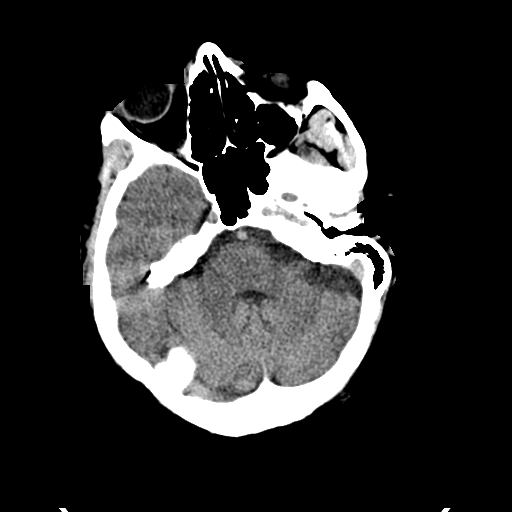
[im 10/36  brain]
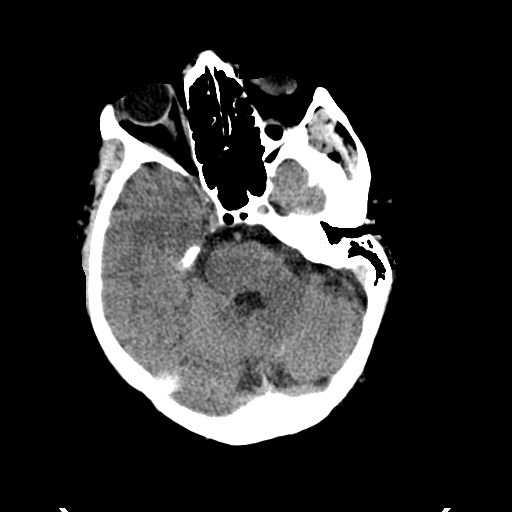
[im 10/36  bone]
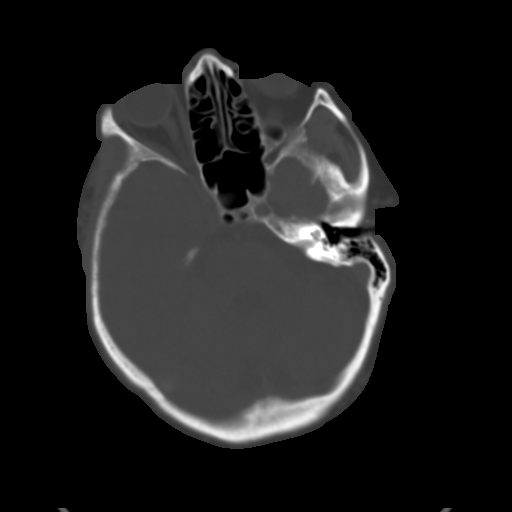
[im 13/36  brain]
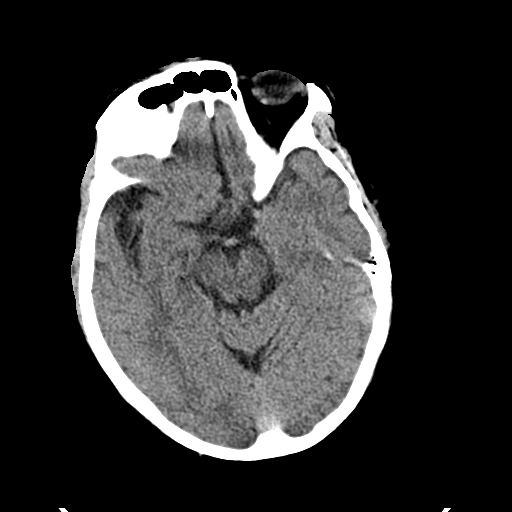
[im 15/36  brain]
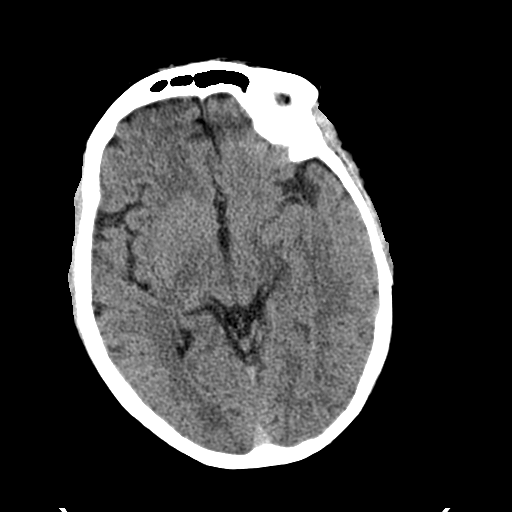
[im 17/36  brain]
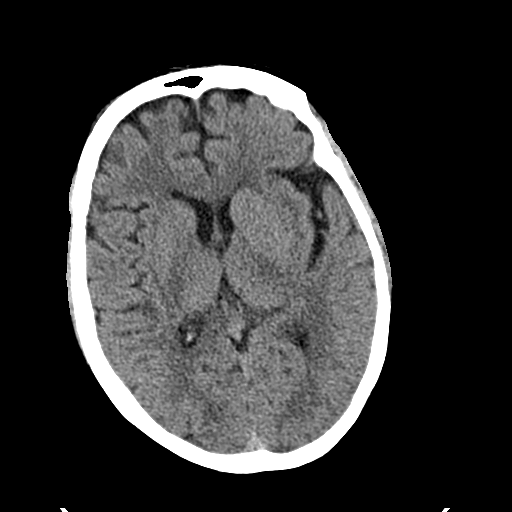
[im 19/36  brain]
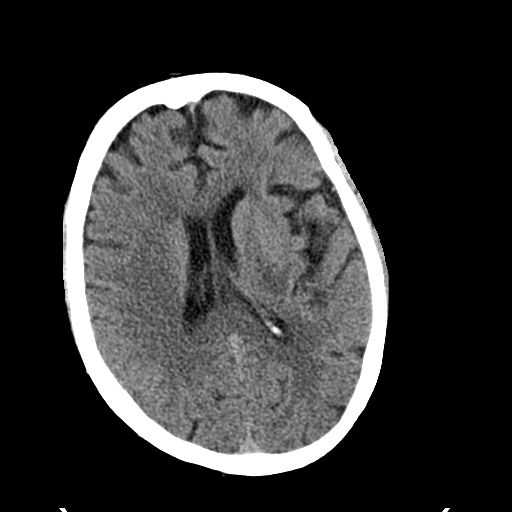
[im 19/36  bone]
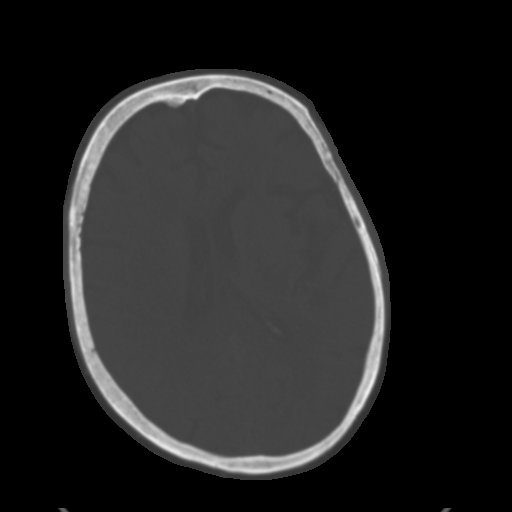
[im 21/36  brain]
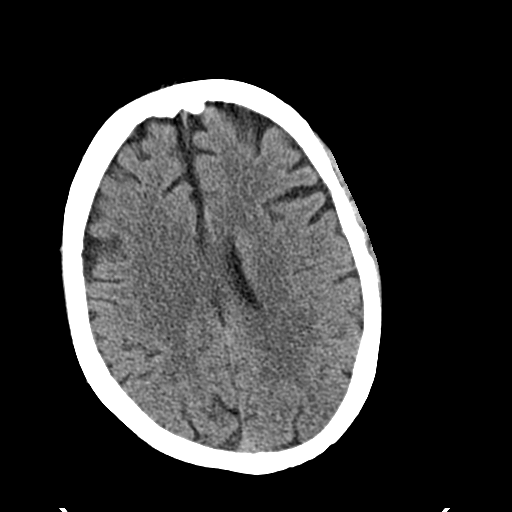
[im 23/36  brain]
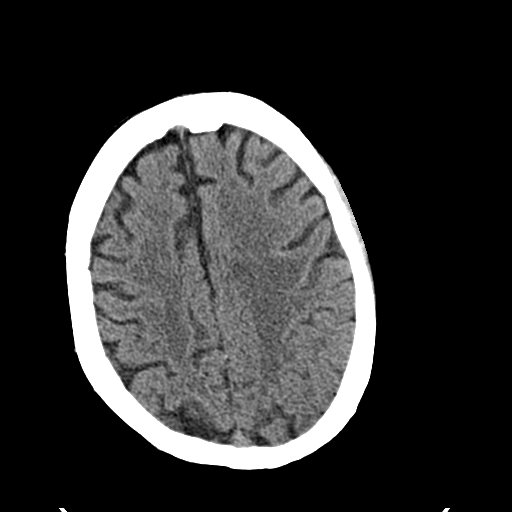
[im 26/36  brain]
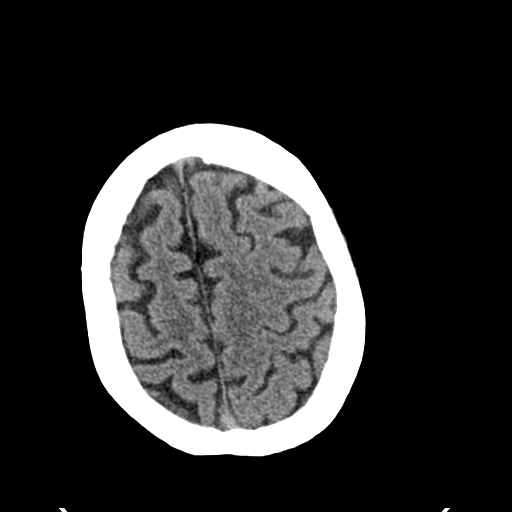
[im 27/36  brain]
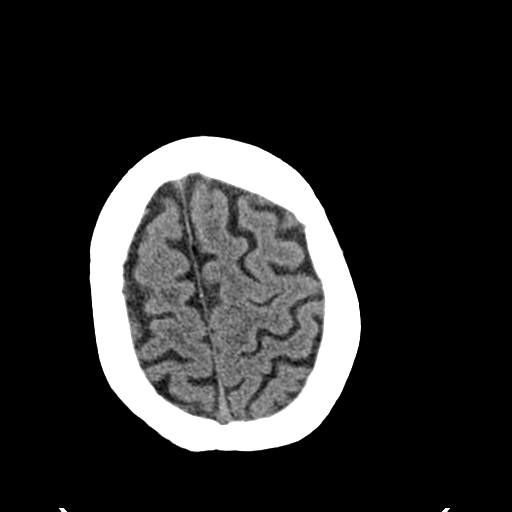
[im 27/36  bone]
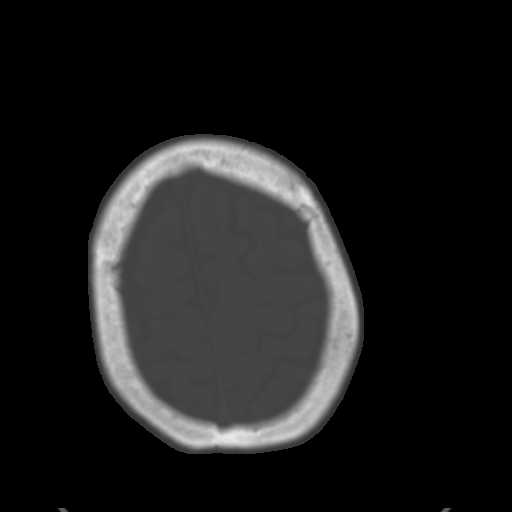
[im 29/36  brain]
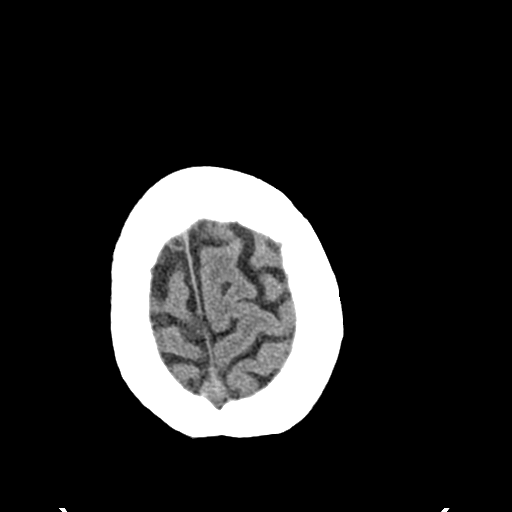
[im 32/36  brain]
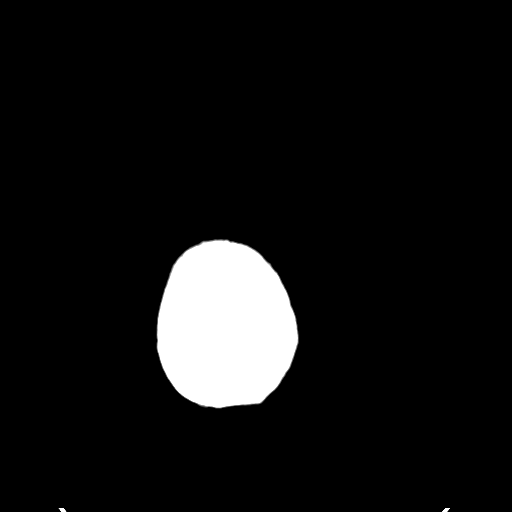
[im 34/36  brain]
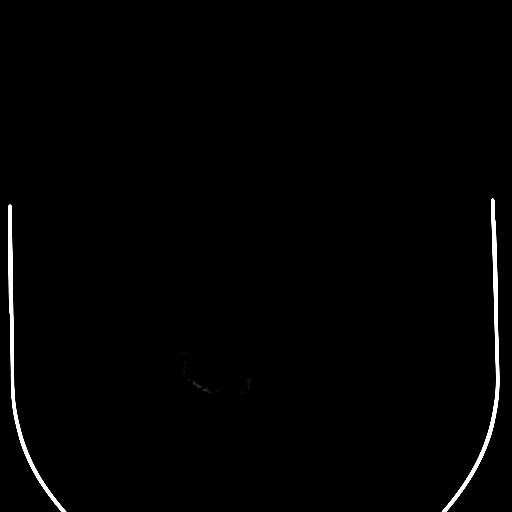

[16 of 30 positions shown; findings below may reference images not displayed]

FINDINGS: Ventricles, cisterns and other CSF spaces are within normal. There
is minimal chronic ischemic microvascular disease. There is no
change in patient's dural-based calcified mass over the posterior
aspect of the right posterior fossa likely calcified meningioma.
There is no mass effect, midline shift or acute hemorrhage. There is
no evidence of acute infarction. Remaining bones and soft tissues
are unremarkable.
IMPRESSION: No acute intracranial findings.

Stable calcified extra-axial mass over the right posterior fossa
likely calcified meningioma.

Mild chronic ischemic microvascular disease.

## 2016-05-06 IMAGING — CR DG CHEST 2V
2 series · 2 of 2 positions shown · non-contrast
Comparison: N/ [DATE], 12/12/2013.  CT chest 05/13/2012.

CLINICAL DATA: 4 week history of cough associated with burning in
the chest. Current history of COPD, diabetes and hypertension.

EXAM:
CHEST  2 VIEW

[w chest ap]
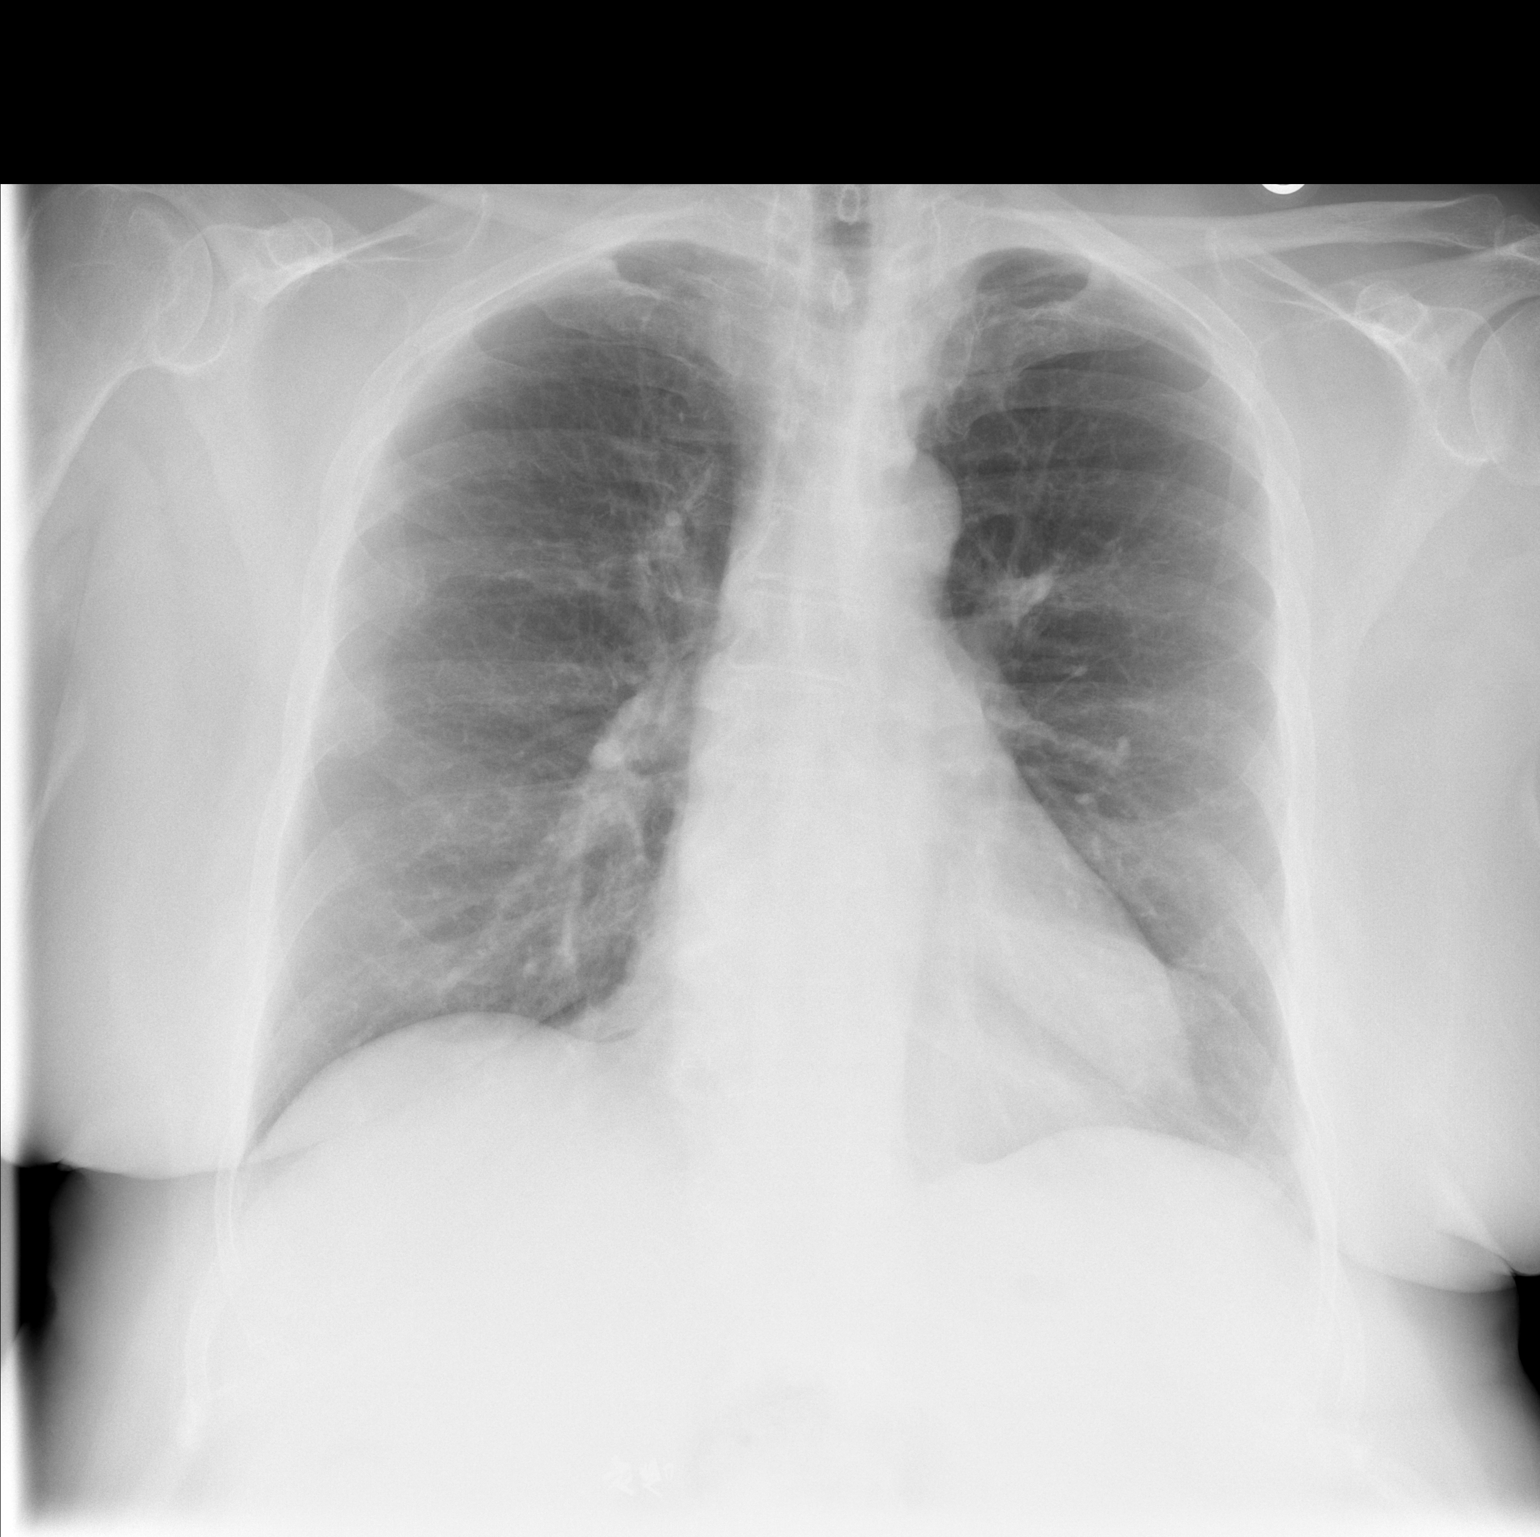

[w chest lat]
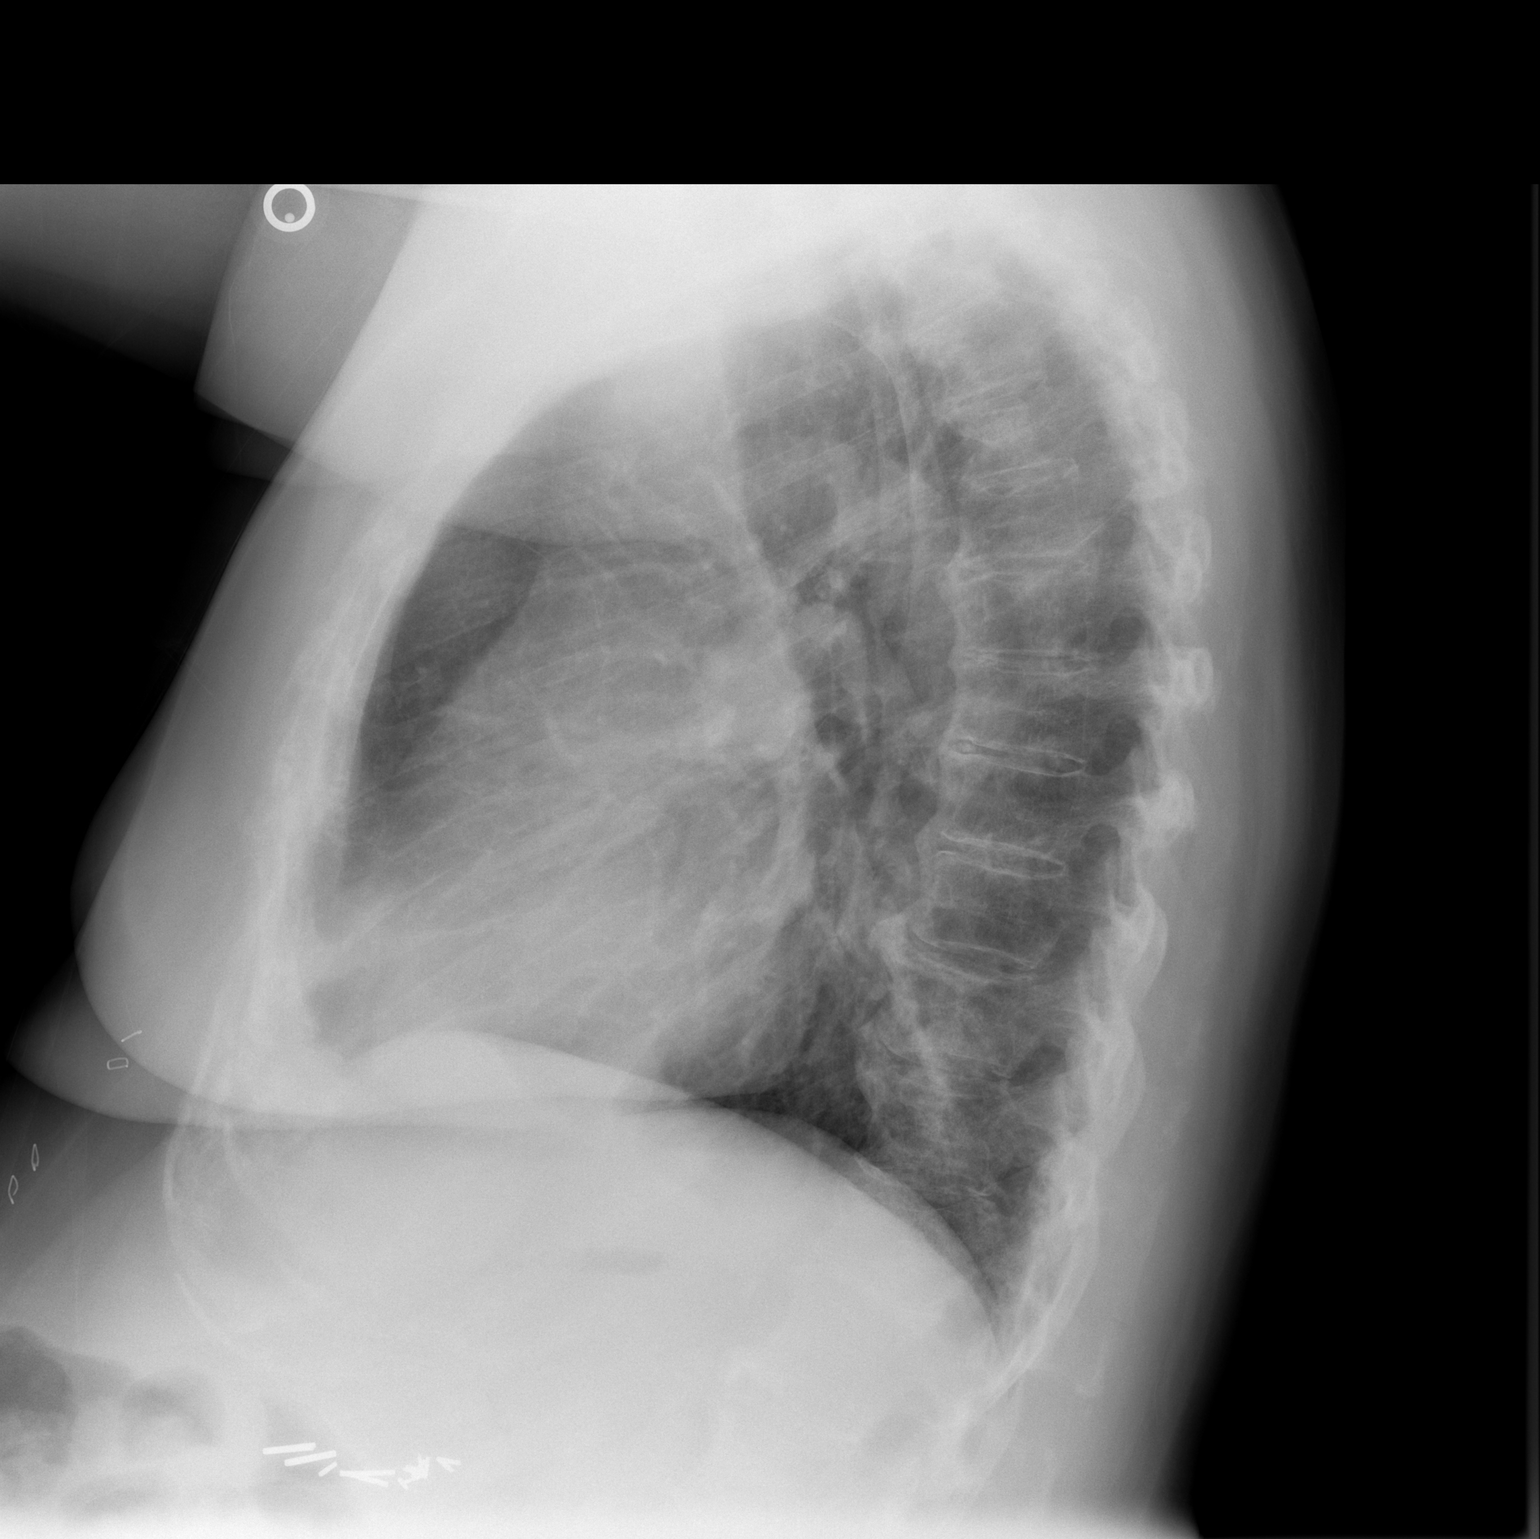

[2 of 2 positions shown; findings below may reference images not displayed]

FINDINGS: Cardiomediastinal silhouette unremarkable, unchanged. Minimal focal
airspace opacity in the right middle lobe. Lungs otherwise clear.
Bronchovascular markings normal. No pleural effusions. No
pneumothorax. Degenerative changes and DISH involving the thoracic
spine.
IMPRESSION: Minimal bronchopneumonia involving the right middle lobe.

## 2022-08-22 DIAGNOSIS — Z9641 Presence of insulin pump (external) (internal): Secondary | ICD-10-CM | POA: Diagnosis not present

## 2022-08-22 DIAGNOSIS — Z713 Dietary counseling and surveillance: Secondary | ICD-10-CM | POA: Diagnosis not present

## 2022-08-22 DIAGNOSIS — Z794 Long term (current) use of insulin: Secondary | ICD-10-CM | POA: Diagnosis not present

## 2022-08-22 DIAGNOSIS — E1140 Type 2 diabetes mellitus with diabetic neuropathy, unspecified: Secondary | ICD-10-CM | POA: Diagnosis not present

## 2022-09-02 DIAGNOSIS — N390 Urinary tract infection, site not specified: Secondary | ICD-10-CM | POA: Diagnosis not present

## 2022-09-06 DIAGNOSIS — Z794 Long term (current) use of insulin: Secondary | ICD-10-CM | POA: Diagnosis not present

## 2022-09-06 DIAGNOSIS — E890 Postprocedural hypothyroidism: Secondary | ICD-10-CM | POA: Diagnosis not present

## 2022-09-06 DIAGNOSIS — E1140 Type 2 diabetes mellitus with diabetic neuropathy, unspecified: Secondary | ICD-10-CM | POA: Diagnosis not present

## 2022-09-06 DIAGNOSIS — M79641 Pain in right hand: Secondary | ICD-10-CM | POA: Diagnosis not present

## 2022-09-06 DIAGNOSIS — M1811 Unilateral primary osteoarthritis of first carpometacarpal joint, right hand: Secondary | ICD-10-CM | POA: Diagnosis not present

## 2022-09-06 DIAGNOSIS — S91002A Unspecified open wound, left ankle, initial encounter: Secondary | ICD-10-CM | POA: Diagnosis not present

## 2023-09-20 ENCOUNTER — Other Ambulatory Visit (HOSPITAL_COMMUNITY): Payer: Self-pay

## 2023-11-25 ENCOUNTER — Other Ambulatory Visit (HOSPITAL_COMMUNITY): Payer: Self-pay

## 2023-11-25 MED ORDER — NORTRIPTYLINE HCL 10 MG PO CAPS
10.0000 mg | ORAL_CAPSULE | Freq: Every day | ORAL | 1 refills | Status: AC
  Filled 2023-11-25 – 2023-11-27 (×2): qty 60, 60d supply, fill #0
  Filled 2024-02-07 – 2024-02-08 (×3): qty 60, 60d supply, fill #1

## 2023-11-25 MED ORDER — DILTIAZEM HCL ER COATED BEADS 240 MG PO CP24
240.0000 mg | ORAL_CAPSULE | Freq: Every day | ORAL | 2 refills | Status: AC
  Filled 2023-11-25: qty 90, 90d supply, fill #0

## 2023-11-25 MED ORDER — METOPROLOL TARTRATE 50 MG PO TABS
50.0000 mg | ORAL_TABLET | Freq: Two times a day (BID) | ORAL | 3 refills | Status: AC
  Filled 2023-11-25 – 2024-02-15 (×2): qty 180, 90d supply, fill #0
  Filled 2024-05-15: qty 180, 90d supply, fill #1

## 2023-11-25 MED ORDER — CICLOPIROX 8 % EX SOLN
Freq: Every day | CUTANEOUS | 0 refills | Status: AC
  Filled 2023-11-25 – 2023-11-27 (×2): qty 6.6, 30d supply, fill #0

## 2023-11-25 MED ORDER — OXYBUTYNIN CHLORIDE ER 10 MG PO TB24: 10.0000 mg | ORAL_TABLET | Freq: Every day | ORAL | 1 refills | Status: AC

## 2023-11-25 MED ORDER — FLUTICASONE-SALMETEROL 250-50 MCG/ACT IN AEPB
1.0000 | INHALATION_SPRAY | Freq: Two times a day (BID) | RESPIRATORY_TRACT | 2 refills | Status: AC
  Filled 2023-11-25 – 2023-11-27 (×2): qty 60, 30d supply, fill #0

## 2023-11-25 MED ORDER — INSULIN LISPRO 100 UNIT/ML IJ SOLN
80.0000 [IU] | Freq: Every day | INTRAMUSCULAR | 4 refills | Status: AC
  Filled 2023-11-25: qty 30, 37d supply, fill #0

## 2023-11-25 MED ORDER — OXYBUTYNIN CHLORIDE ER 10 MG PO TB24
10.0000 mg | ORAL_TABLET | Freq: Every day | ORAL | 1 refills | Status: AC
  Filled 2023-11-25 – 2023-11-27 (×2): qty 90, 90d supply, fill #0

## 2023-11-25 MED ORDER — DICLOFENAC SODIUM 1 % EX GEL
Freq: Four times a day (QID) | CUTANEOUS | 1 refills | Status: AC | PRN
  Filled 2023-11-25: qty 100, 25d supply, fill #0
  Filled 2023-11-27: qty 100, 30d supply, fill #0

## 2023-11-25 MED ORDER — METHENAMINE HIPPURATE 1 G PO TABS
1.0000 g | ORAL_TABLET | Freq: Two times a day (BID) | ORAL | 11 refills | Status: AC
  Filled 2023-11-25 – 2023-11-27 (×2): qty 60, 30d supply, fill #0

## 2023-11-25 MED ORDER — OMNIPOD 5 DEXG7G6 PODS GEN 5 MISC
3 refills | Status: AC
  Filled 2023-11-25 – 2023-11-27 (×2): qty 20, 60d supply, fill #0

## 2023-11-25 MED ORDER — VENLAFAXINE HCL ER 75 MG PO CP24
225.0000 mg | ORAL_CAPSULE | Freq: Every day | ORAL | 2 refills | Status: DC
  Filled 2023-11-25 – 2023-11-27 (×2): qty 90, 30d supply, fill #0

## 2023-11-25 MED ORDER — DILTIAZEM HCL ER COATED BEADS 240 MG PO CP24
240.0000 mg | ORAL_CAPSULE | Freq: Every day | ORAL | 2 refills | Status: DC
  Filled 2023-11-27 – 2024-01-12 (×2): qty 90, 90d supply, fill #0

## 2023-11-25 MED ORDER — LEVOTHYROXINE SODIUM 125 MCG PO TABS
125.0000 ug | ORAL_TABLET | Freq: Every day | ORAL | 1 refills | Status: AC
  Filled 2023-11-25: qty 90, 90d supply, fill #0

## 2023-11-27 ENCOUNTER — Other Ambulatory Visit: Payer: Self-pay

## 2023-11-27 ENCOUNTER — Other Ambulatory Visit (HOSPITAL_COMMUNITY): Payer: Self-pay

## 2023-11-28 ENCOUNTER — Other Ambulatory Visit (HOSPITAL_COMMUNITY): Payer: Self-pay

## 2023-11-28 ENCOUNTER — Other Ambulatory Visit: Payer: Self-pay

## 2023-11-29 ENCOUNTER — Other Ambulatory Visit: Payer: Self-pay

## 2023-11-30 ENCOUNTER — Other Ambulatory Visit (HOSPITAL_COMMUNITY): Payer: Self-pay

## 2023-12-07 ENCOUNTER — Other Ambulatory Visit (HOSPITAL_COMMUNITY): Payer: Self-pay

## 2023-12-07 MED ORDER — FREESTYLE LIBRE 2 PLUS SENSOR MISC
1 refills | Status: DC
  Filled 2023-12-07: qty 6, 90d supply, fill #0

## 2023-12-07 MED ORDER — OMNIPOD 5 LIBRE2 PLUS G6 PODS MISC
1 refills | Status: AC
  Filled 2023-12-07: qty 45, 90d supply, fill #0

## 2023-12-08 ENCOUNTER — Other Ambulatory Visit (HOSPITAL_COMMUNITY): Payer: Self-pay

## 2023-12-08 ENCOUNTER — Other Ambulatory Visit: Payer: Self-pay

## 2023-12-13 ENCOUNTER — Other Ambulatory Visit (HOSPITAL_COMMUNITY): Payer: Self-pay

## 2023-12-13 ENCOUNTER — Other Ambulatory Visit: Payer: Self-pay

## 2023-12-13 MED ORDER — OMNIPOD 5 LIBRE2 PLUS G6 PODS MISC
1 refills | Status: DC
  Filled 2023-12-13: qty 45, 90d supply, fill #0

## 2023-12-13 MED ORDER — ONETOUCH DELICA PLUS LANCET30G MISC
1 refills | Status: AC
  Filled 2023-12-13: qty 400, 100d supply, fill #0

## 2023-12-14 ENCOUNTER — Other Ambulatory Visit: Payer: Self-pay

## 2023-12-26 ENCOUNTER — Other Ambulatory Visit: Payer: Self-pay

## 2024-01-03 ENCOUNTER — Other Ambulatory Visit (HOSPITAL_COMMUNITY): Payer: Self-pay

## 2024-01-03 MED ORDER — PREGABALIN 150 MG PO CAPS
150.0000 mg | ORAL_CAPSULE | Freq: Three times a day (TID) | ORAL | 0 refills | Status: DC
  Filled 2024-01-03: qty 90, 30d supply, fill #0

## 2024-01-12 ENCOUNTER — Other Ambulatory Visit (HOSPITAL_COMMUNITY): Payer: Self-pay

## 2024-01-12 ENCOUNTER — Other Ambulatory Visit: Payer: Self-pay

## 2024-01-25 ENCOUNTER — Other Ambulatory Visit: Payer: Self-pay

## 2024-01-25 ENCOUNTER — Other Ambulatory Visit (HOSPITAL_COMMUNITY): Payer: Self-pay

## 2024-01-25 MED ORDER — UMECLIDINIUM-VILANTEROL 62.5-25 MCG/ACT IN AEPB
1.0000 | INHALATION_SPRAY | Freq: Every morning | RESPIRATORY_TRACT | 3 refills | Status: AC
  Filled 2024-01-25 – 2024-02-09 (×2): qty 60, 30d supply, fill #0
  Filled 2024-05-15: qty 60, 30d supply, fill #1

## 2024-01-30 ENCOUNTER — Other Ambulatory Visit: Payer: Self-pay

## 2024-02-07 ENCOUNTER — Other Ambulatory Visit (HOSPITAL_COMMUNITY): Payer: Self-pay

## 2024-02-08 ENCOUNTER — Other Ambulatory Visit (HOSPITAL_COMMUNITY): Payer: Self-pay

## 2024-02-08 ENCOUNTER — Other Ambulatory Visit: Payer: Self-pay

## 2024-02-09 ENCOUNTER — Other Ambulatory Visit (HOSPITAL_COMMUNITY): Payer: Self-pay

## 2024-02-10 ENCOUNTER — Other Ambulatory Visit (HOSPITAL_COMMUNITY): Payer: Self-pay

## 2024-02-15 ENCOUNTER — Other Ambulatory Visit (HOSPITAL_COMMUNITY): Payer: Self-pay

## 2024-02-15 ENCOUNTER — Other Ambulatory Visit: Payer: Self-pay

## 2024-03-07 ENCOUNTER — Other Ambulatory Visit (HOSPITAL_COMMUNITY): Payer: Self-pay

## 2024-03-07 ENCOUNTER — Other Ambulatory Visit (HOSPITAL_BASED_OUTPATIENT_CLINIC_OR_DEPARTMENT_OTHER): Payer: Self-pay

## 2024-03-08 ENCOUNTER — Other Ambulatory Visit (HOSPITAL_COMMUNITY): Payer: Self-pay

## 2024-03-08 MED ORDER — FREESTYLE LIBRE 2 PLUS SENSOR MISC
1 refills | Status: AC
  Filled 2024-03-08 – 2024-05-01 (×2): qty 6, 90d supply, fill #0
  Filled 2024-05-02: qty 4, 60d supply, fill #1

## 2024-03-11 ENCOUNTER — Other Ambulatory Visit (HOSPITAL_COMMUNITY): Payer: Self-pay

## 2024-03-14 ENCOUNTER — Other Ambulatory Visit (HOSPITAL_COMMUNITY): Payer: Self-pay

## 2024-03-15 ENCOUNTER — Other Ambulatory Visit (HOSPITAL_COMMUNITY): Payer: Self-pay

## 2024-03-15 MED ORDER — PREGABALIN 150 MG PO CAPS
150.0000 mg | ORAL_CAPSULE | Freq: Three times a day (TID) | ORAL | 0 refills | Status: AC
  Filled 2024-03-15: qty 90, 30d supply, fill #0

## 2024-04-20 ENCOUNTER — Other Ambulatory Visit (HOSPITAL_COMMUNITY): Payer: Self-pay

## 2024-04-20 MED ORDER — DILTIAZEM HCL ER COATED BEADS 240 MG PO CP24
240.0000 mg | ORAL_CAPSULE | Freq: Every day | ORAL | 1 refills | Status: AC
  Filled 2024-04-20: qty 90, 90d supply, fill #0

## 2024-04-21 ENCOUNTER — Other Ambulatory Visit (HOSPITAL_COMMUNITY): Payer: Self-pay

## 2024-04-26 ENCOUNTER — Other Ambulatory Visit: Payer: Self-pay

## 2024-04-26 ENCOUNTER — Other Ambulatory Visit (HOSPITAL_COMMUNITY): Payer: Self-pay

## 2024-04-26 MED ORDER — ACCU-CHEK FASTCLIX LANCET KIT
PACK | 0 refills | Status: AC
  Filled 2024-04-26: qty 1, 30d supply, fill #0

## 2024-04-26 MED ORDER — ACCU-CHEK FASTCLIX LANCETS MISC
3 refills | Status: AC
  Filled 2024-04-26: qty 204, 50d supply, fill #0

## 2024-04-26 MED ORDER — ACCU-CHEK GUIDE TEST VI STRP
ORAL_STRIP | 3 refills | Status: AC
  Filled 2024-04-26: qty 200, 50d supply, fill #0

## 2024-04-26 MED ORDER — INSULIN LISPRO 100 UNIT/ML IJ SOLN
INTRAMUSCULAR | 1 refills | Status: AC
  Filled 2024-04-26: qty 70, 100d supply, fill #0

## 2024-04-26 MED FILL — Continuous Glucose System Sensor: 90 days supply | Qty: 6 | Fill #0 | Status: AC

## 2024-05-01 ENCOUNTER — Other Ambulatory Visit (HOSPITAL_COMMUNITY): Payer: Self-pay

## 2024-05-02 ENCOUNTER — Other Ambulatory Visit: Payer: Self-pay

## 2024-05-02 ENCOUNTER — Other Ambulatory Visit (HOSPITAL_COMMUNITY): Payer: Self-pay

## 2024-05-03 ENCOUNTER — Other Ambulatory Visit (HOSPITAL_COMMUNITY): Payer: Self-pay

## 2024-05-15 ENCOUNTER — Other Ambulatory Visit (HOSPITAL_COMMUNITY): Payer: Self-pay

## 2024-05-21 ENCOUNTER — Other Ambulatory Visit (HOSPITAL_COMMUNITY): Payer: Self-pay

## 2024-05-28 ENCOUNTER — Other Ambulatory Visit (HOSPITAL_BASED_OUTPATIENT_CLINIC_OR_DEPARTMENT_OTHER): Payer: Self-pay

## 2024-05-28 ENCOUNTER — Other Ambulatory Visit (HOSPITAL_COMMUNITY): Payer: Self-pay

## 2024-05-29 ENCOUNTER — Other Ambulatory Visit (HOSPITAL_COMMUNITY): Payer: Self-pay

## 2024-06-06 ENCOUNTER — Other Ambulatory Visit (HOSPITAL_COMMUNITY): Payer: Self-pay

## 2024-06-06 MED ORDER — MELOXICAM 7.5 MG PO TABS
7.5000 mg | ORAL_TABLET | Freq: Every morning | ORAL | 11 refills | Status: AC
  Filled 2024-06-06: qty 30, 30d supply, fill #0

## 2024-06-07 ENCOUNTER — Other Ambulatory Visit (HOSPITAL_COMMUNITY): Payer: Self-pay

## 2024-06-07 MED ORDER — PREGABALIN 150 MG PO CAPS
150.0000 mg | ORAL_CAPSULE | Freq: Three times a day (TID) | ORAL | 0 refills | Status: AC
  Filled 2024-06-07: qty 90, 30d supply, fill #0

## 2024-06-09 ENCOUNTER — Other Ambulatory Visit (HOSPITAL_COMMUNITY): Payer: Self-pay

## 2024-06-17 ENCOUNTER — Other Ambulatory Visit (HOSPITAL_COMMUNITY): Payer: Self-pay

## 2024-06-17 MED ORDER — ATORVASTATIN CALCIUM 20 MG PO TABS
20.0000 mg | ORAL_TABLET | Freq: Every day | ORAL | 1 refills | Status: AC
  Filled 2024-06-17: qty 90, 90d supply, fill #0

## 2024-06-21 ENCOUNTER — Other Ambulatory Visit: Payer: Self-pay

## 2024-06-21 ENCOUNTER — Other Ambulatory Visit (HOSPITAL_COMMUNITY): Payer: Self-pay

## 2024-06-21 MED ORDER — ESTRING 7.5 MCG/24HR VA RING
VAGINAL_RING | VAGINAL | 3 refills | Status: AC
  Filled 2024-06-21: qty 1, 90d supply, fill #0

## 2024-06-21 MED ORDER — TROSPIUM CHLORIDE 20 MG PO TABS
ORAL_TABLET | ORAL | 2 refills | Status: AC
  Filled 2024-06-21: qty 60, 30d supply, fill #0
